# Patient Record
Sex: Male | Born: 1965 | Race: White | Hispanic: No | Marital: Married | Smoking: Never smoker
Health system: Southern US, Community
[De-identification: ages and names within clinical notes are randomized; demographics above are authoritative.]

## PROBLEM LIST (undated history)

## (undated) DIAGNOSIS — R011 Cardiac murmur, unspecified: Secondary | ICD-10-CM

## (undated) DIAGNOSIS — N135 Crossing vessel and stricture of ureter without hydronephrosis: Secondary | ICD-10-CM

## (undated) DIAGNOSIS — M1990 Unspecified osteoarthritis, unspecified site: Secondary | ICD-10-CM

## (undated) HISTORY — PX: KNEE ARTHROSCOPY W/ MENISCAL REPAIR: SHX1877

## (undated) HISTORY — PX: KNEE ARTHROSCOPY: SUR90

## (undated) HISTORY — PX: TONSILLECTOMY: SUR1361

## (undated) HISTORY — PX: WISDOM TOOTH EXTRACTION: SHX21

---

## 2010-07-12 ENCOUNTER — Encounter
Admission: RE | Admit: 2010-07-12 | Discharge: 2010-07-12 | Payer: Self-pay | Source: Home / Self Care | Attending: Internal Medicine | Admitting: Internal Medicine

## 2011-12-29 ENCOUNTER — Other Ambulatory Visit: Payer: Self-pay | Admitting: Orthopaedic Surgery

## 2011-12-29 DIAGNOSIS — M25562 Pain in left knee: Secondary | ICD-10-CM

## 2012-01-06 ENCOUNTER — Other Ambulatory Visit: Payer: Self-pay | Admitting: Orthopaedic Surgery

## 2012-01-06 ENCOUNTER — Ambulatory Visit
Admission: RE | Admit: 2012-01-06 | Discharge: 2012-01-06 | Disposition: A | Discharge status: Home / Self Care | Payer: 59 | Source: Ambulatory Visit | Attending: Orthopaedic Surgery | Admitting: Orthopaedic Surgery

## 2012-01-06 DIAGNOSIS — M25562 Pain in left knee: Secondary | ICD-10-CM

## 2012-02-02 ENCOUNTER — Other Ambulatory Visit: Payer: Self-pay | Admitting: Orthopaedic Surgery

## 2012-02-02 ENCOUNTER — Ambulatory Visit
Admission: RE | Admit: 2012-02-02 | Discharge: 2012-02-02 | Disposition: A | Discharge status: Home / Self Care | Payer: 59 | Source: Ambulatory Visit | Attending: Orthopaedic Surgery | Admitting: Orthopaedic Surgery

## 2012-02-02 DIAGNOSIS — R609 Edema, unspecified: Secondary | ICD-10-CM

## 2012-07-28 ENCOUNTER — Other Ambulatory Visit: Payer: Self-pay | Admitting: Orthopaedic Surgery

## 2012-07-28 DIAGNOSIS — R609 Edema, unspecified: Secondary | ICD-10-CM

## 2012-07-28 DIAGNOSIS — R52 Pain, unspecified: Secondary | ICD-10-CM

## 2012-08-03 ENCOUNTER — Other Ambulatory Visit: Payer: 59

## 2013-11-09 ENCOUNTER — Other Ambulatory Visit (HOSPITAL_COMMUNITY): Payer: Self-pay | Admitting: Orthopaedic Surgery

## 2013-11-09 ENCOUNTER — Ambulatory Visit (HOSPITAL_COMMUNITY)
Admission: RE | Admit: 2013-11-09 | Discharge: 2013-11-09 | Disposition: A | Discharge status: Home / Self Care | Payer: 59 | Source: Ambulatory Visit | Attending: Orthopaedic Surgery | Admitting: Orthopaedic Surgery

## 2013-11-09 DIAGNOSIS — M545 Low back pain, unspecified: Secondary | ICD-10-CM

## 2013-11-09 DIAGNOSIS — M51379 Other intervertebral disc degeneration, lumbosacral region without mention of lumbar back pain or lower extremity pain: Secondary | ICD-10-CM | POA: Insufficient documentation

## 2013-11-09 DIAGNOSIS — M48061 Spinal stenosis, lumbar region without neurogenic claudication: Secondary | ICD-10-CM | POA: Insufficient documentation

## 2013-11-09 DIAGNOSIS — M5126 Other intervertebral disc displacement, lumbar region: Secondary | ICD-10-CM | POA: Insufficient documentation

## 2013-11-09 DIAGNOSIS — M5146 Schmorl's nodes, lumbar region: Secondary | ICD-10-CM | POA: Insufficient documentation

## 2013-11-09 DIAGNOSIS — M5137 Other intervertebral disc degeneration, lumbosacral region: Secondary | ICD-10-CM | POA: Insufficient documentation

## 2014-04-21 ENCOUNTER — Other Ambulatory Visit (HOSPITAL_COMMUNITY): Payer: Self-pay | Admitting: Specialist

## 2014-04-28 ENCOUNTER — Other Ambulatory Visit (HOSPITAL_COMMUNITY): Payer: Self-pay | Admitting: *Deleted

## 2014-04-28 ENCOUNTER — Encounter (HOSPITAL_COMMUNITY): Payer: Self-pay | Admitting: Pharmacy Technician

## 2014-04-28 ENCOUNTER — Encounter (HOSPITAL_COMMUNITY)
Admission: RE | Admit: 2014-04-28 | Discharge: 2014-04-28 | Disposition: A | Discharge status: Home / Self Care | Payer: 59 | Source: Ambulatory Visit | Attending: Specialist | Admitting: Specialist

## 2014-04-28 ENCOUNTER — Encounter (HOSPITAL_COMMUNITY): Payer: Self-pay

## 2014-04-28 DIAGNOSIS — M5126 Other intervertebral disc displacement, lumbar region: Secondary | ICD-10-CM | POA: Insufficient documentation

## 2014-04-28 DIAGNOSIS — Z01812 Encounter for preprocedural laboratory examination: Secondary | ICD-10-CM | POA: Insufficient documentation

## 2014-04-28 HISTORY — DX: Unspecified osteoarthritis, unspecified site: M19.90

## 2014-04-28 HISTORY — DX: Crossing vessel and stricture of ureter without hydronephrosis: N13.5

## 2014-04-28 HISTORY — DX: Cardiac murmur, unspecified: R01.1

## 2014-04-28 LAB — COMPREHENSIVE METABOLIC PANEL
ALBUMIN: 4.3 g/dL (ref 3.5–5.2)
ALT: 25 U/L (ref 0–53)
ANION GAP: 12 (ref 5–15)
AST: 40 U/L — ABNORMAL HIGH (ref 0–37)
Alkaline Phosphatase: 77 U/L (ref 39–117)
BILIRUBIN TOTAL: 0.4 mg/dL (ref 0.3–1.2)
BUN: 12 mg/dL (ref 6–23)
CALCIUM: 9.4 mg/dL (ref 8.4–10.5)
CHLORIDE: 101 meq/L (ref 96–112)
CO2: 25 meq/L (ref 19–32)
CREATININE: 0.9 mg/dL (ref 0.50–1.35)
GFR calc Af Amer: >90 mL/min (ref >90)
Glucose, Bld: 85 mg/dL (ref 70–99)
Potassium: 4.1 mEq/L (ref 3.7–5.3)
Sodium: 138 mEq/L (ref 137–147)
Total Protein: 7 g/dL (ref 6.0–8.3)

## 2014-04-28 LAB — CBC
HEMATOCRIT: 36 % — AB (ref 39.0–52.0)
Hemoglobin: 12.7 g/dL — ABNORMAL LOW (ref 13.0–17.0)
MCH: 32.6 pg (ref 26.0–34.0)
MCHC: 35.3 g/dL (ref 30.0–36.0)
MCV: 92.5 fL (ref 78.0–100.0)
Platelets: 229 10*3/uL (ref 150–400)
RBC: 3.89 MIL/uL — ABNORMAL LOW (ref 4.22–5.81)
RDW: 13.5 % (ref 11.5–15.5)
WBC: 4.3 10*3/uL (ref 4.0–10.5)

## 2014-04-28 LAB — SURGICAL PCR SCREEN
MRSA, PCR: NEGATIVE
Staphylococcus aureus: NEGATIVE

## 2014-04-28 NOTE — Progress Notes (Signed)
Pt's PCP is Dr. Nehemiah [facility]Polite [facility]

## 2014-04-28 NOTE — Pre-Procedure Instructions (Signed)
Brian Merritt  04/28/2014   Your procedure is scheduled on:  Friday, May 05, 2014 at 12:30 AM.   [facility] at 10:30 AM.   Call this number if you have problems the morning of surgery: [phone]   Remember:   Do not eat food or drink liquids after midnight Thursday, 05/04/14.   Take these medicines the morning of surgery with A SIP OF WATER: Oxycodone HCl - if needed  Stop Vitamins as of today.   Do not wear jewelry.  Do not wear lotions, powders, or cologne. You may wear deodorant.  Men may shave face and neck.  Do not bring valuables to the hospital.  [facility] is not responsible                  for any belongings or valuables.               Contacts, dentures or bridgework may not be worn into surgery.  Leave suitcase in the car. After surgery it may be brought to your room.  For patients admitted to the hospital, discharge time is determined by your                treatment team.               Patients discharged the day of surgery will not be allowed to drive home.    Special Instructions: [place] - Preparing for Surgery  Before surgery, you can play an important role.  Because skin is not sterile, your skin needs to be as free of germs as possible.  You can reduce the number of germs on you skin by washing with CHG (chlorahexidine gluconate) soap before surgery.  CHG is an antiseptic cleaner which kills germs and bonds with the skin to continue killing germs even after washing.  Please DO NOT use if you have an allergy to CHG or antibacterial soaps.  If your skin becomes reddened/irritated stop using the CHG and inform your nurse when you arrive at Short Stay.  Do not shave (including legs and underarms) for at least 48 hours prior to the first CHG shower.  You may shave your face.  Please follow these instructions carefully:   1.  Shower with CHG Soap the night before surgery and the                                 morning of Surgery.  2.  If you choose to wash your hair, wash your hair first as usual with your       normal shampoo.  3.  After you shampoo, rinse your hair and body thoroughly to remove the                      Shampoo.  4.  Use CHG as you would any other liquid soap.  You can apply chg directly       to the skin and wash gently with scrungie or a clean washcloth.  5.  Apply the CHG Soap to your body ONLY FROM THE NECK DOWN.        Do not use on open wounds or open sores.  Avoid contact with your eyes, ears, mouth and genitals (private parts).  Wash genitals (private parts) with your normal soap.  6.  Wash thoroughly, paying special attention to the area  where your surgery        will be performed.  7.  Thoroughly rinse your body with warm water from the neck down.  8.  DO NOT shower/wash with your normal soap after using and rinsing off       the CHG Soap.  9.  Pat yourself dry with a clean towel.            10.  Wear clean pajamas.            11.  Place clean sheets on your bed the night of your first shower and do not        sleep with pets.  Day of Surgery  Do not apply any lotions the morning of surgery.  Please wear clean clothes to the hospital.     Please read over the following fact sheets that you were given: Pain Booklet, Coughing and Deep Breathing, MRSA Information and Surgical Site Infection Prevention

## 2014-05-03 NOTE — H&P (Signed)
Brian Merritt is an 48 y.o. male.  Chief Complaint: back pain and right LE pain and weakness.  HISTORY OF PRESENT ILLNESS:   He relates that he is experiencing back pain that is persisting with radiation into his right lower extremity.  The pain is mainly into the right leg more than it is into his back.  No particular numbness. Reports there is always a dull-type discomfort into his right lower extremity.  He has tried a Medrol Dosepak but it did not seem to be of much benefit.  Pt has been followed for his back pain and he has a herniated disc at the  L3-4 level with a free fragment on the right side that extends downward along the right L4 nerve.  He relates that he is at a point where he wishes to go ahead and consider intervention surgically.  This discomfort started in May 2015 and he has been trying to get over the problem of nerve compression and pain radiating into his right lower extremity with sciatica since May.  The pain is sharp, radiating into the right lower extremity anteriorly and laterally along the right calf.  He initially had weakness associated with the right lower extremity and weakness into his right knee extension.  He notes that he still has some weakness in the right leg.  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  His tension signs were positive.  He underwent epidural steroid injection on the right side at the L3-4 level transforaminal as well as at the right L4 transforaminal between L4 and L5 in order to decrease the swelling associated with this nerve.  Has been performing exercises on his own.  He has been doing a regimen of both extension and flexion exercises.  He finds that with extension the pain is worse.  With standing and ambulation the pain is worse.  Workplace has tried to find ways to help him to be able to adapt to his back pain.  He is using  elevated desks and elevated chairs, but overall he has been putting up with this pain and discomfort for almost 4 months without a great deal of relief of pain.  He is continuing to require OxyContin 20-mg tablet 1 every 12 hours to try an relieve his pain.  He has been on OxyContin now since June 4, nearly 3 months.  Sterapred dose pack was given in May and then repeated a second time in May and then he received a 12-day dose pack in August, so he has had a fair amount of attempts at quelling the swelling and inflammation of the nerve with steroid treatment.   He still persists with pain.  On a scale of 1 to 10 he relates his pain as about a 7 or 8.  He has difficulty sleeping at night.  He is sleeping perhaps 3 or 4 hours maximum.  He has persistent weakness going up and down stairs with the right leg. RADIOGRAPHS:  His MRI scan has demonstrated right subarticular disc extrusion with free fragment and the right lateral recess posterior to the L4 vertebral body.  This is impressing on the right L4 nerve root.  He also has a shallow central protrusion at L4-5 with some mild stenosis at this segment.  We did perform x-rays on him.  He underwent flexion and extension lateral radiographs.  His radiographs  show degenerative disc narrowing at L4-5 and also some minimal narrowing at L3-4 and at L2-3.  Anterior spurring is evident at L2-3, L3-4 and L4-5.  Disc space at L5-S1 is also narrowed, so that he does have multilevel degenerative disc changes here.  With flexion and extension, I see no gross abnormal motion across the lumbar segments.  There is no sign of dynamic instability here so that the problem seems to be primarily a right-sided disc herniation at L4-5 with persistent L4 radiculopathy.  ASSESSMENT:  This patient is nearly 4 months out from having a right-sided L3-4 disc herniation with extruded fragment into the lateral canal at L3-4, extending down ventral to the L4 nerve root.  He has significant  persistent sciatic tension signs, severe limitations of function and is requiring continued narcotic medicines to relieve his pain.  PLAN:  At this point pt is at a point where resection of the herniated disc and decompression of his L4 root will provide him with the best treatment and at this point this is a conservative way to treat ongoing severe radicular symptoms due to a disc herniation with extruded fragment. pt will be undergoing a right-sided L3-4 microdiscectomy with excision of extruded fragment.  We will explore the disc and ensure that there are no further fragments within the disc that require excision in addition to excising the extruded fragment that extends caudally ventral to the L4 nerve root.  The risks of surgery including risk of infection, bleeding, and risk of neurologic compromise discussed with pt and he wishes to proceed.   Past Medical History  Diagnosis Date  . Heart murmur     as a child  . Arthritis     knee  . Ureteral stricture     as a child    Past Surgical History  Procedure Laterality Date  . Knee arthroscopy w/ meniscal repair Left   . Tonsillectomy    . Wisdom tooth extraction      Family History  Problem Relation Age of Onset  . Heart disease Mother   . Hypertension Father    Social History:  reports that he has never smoked. His smokeless tobacco use includes Chew. He reports that he drinks alcohol. He reports that he does not use illicit drugs.  Allergies:  Allergies  Allergen Reactions  . Advil [Ibuprofen] Hives    Hives, swelling  Can take Motrin, but usually takes tylenol or aleve  . Penicillins Hives    Medications Prior to Admission  Medication Sig Dispense Refill  . Multiple Vitamins-Minerals (ONE DAILY MENS) TABS Take 1 tablet by mouth daily.    . Oxycodone HCl 20 MG TABS Take 20 mg by mouth 2 (two) times daily as needed (pain).      No results found for this or any previous visit (from the past 48 hour(s)). No results  found.  Review of Systems  Musculoskeletal: Positive for back pain.  Neurological:       Right leg pain  All other systems reviewed and are negative.   Blood pressure 112/75, pulse 57, temperature 97.7 F (36.5 C), temperature source Oral, resp. rate 18, height 6' 2.5" (1.892 m), weight 83.008 kg (183 lb), SpO2 100 %. Physical Exam  Constitutional: He is oriented to person, place, and time. He appears well-developed and well-nourished.  HENT:  Head: Normocephalic and atraumatic.  Eyes: EOM are normal. Pupils are equal, round, and reactive to light.  Neck: Normal range of motion.  Cardiovascular: Normal rate, regular  rhythm and intact distal pulses.   Respiratory: Effort normal and breath sounds normal.  GI: Soft.  Musculoskeletal:    He has a positive straight leg raise sign on the right side in a dangling position, 30 degrees short of full extension.  He has a positive popliteal compression sign on the right.  Weakness in right foot dorsiflexion, 5-/5 but improved compared with his previous exam when he first presented.  Right knee extension strength is intact.  He is unable to fully extend, though, without reversing normal lordotic curve and extending his back.  Reverse straight leg raise is positive on the right.  Neurological: He is alert and oriented to person, place, and time.  Skin: Skin is warm and dry.  Psychiatric: He has a normal mood and affect.     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  Delmos Velaquez E 05/05/2014, 1:13 PM

## 2014-05-04 MED ORDER — CLINDAMYCIN PHOSPHATE 900 MG/50ML IV SOLN
900.0000 mg | INTRAVENOUS | Status: AC
  Administered 2014-05-05: 900 mg via INTRAVENOUS
  Filled 2014-05-04: qty 50

## 2014-05-05 ENCOUNTER — Ambulatory Visit (HOSPITAL_COMMUNITY)
Admission: RE | Admit: 2014-05-05 | Discharge: 2014-05-05 | Disposition: A | Discharge status: Home / Self Care | Payer: 59 | Source: Ambulatory Visit | Attending: Specialist | Admitting: Specialist

## 2014-05-05 ENCOUNTER — Encounter (HOSPITAL_COMMUNITY): Payer: Self-pay | Admitting: Anesthesiology

## 2014-05-05 ENCOUNTER — Ambulatory Visit (HOSPITAL_COMMUNITY): Payer: 59

## 2014-05-05 ENCOUNTER — Encounter (HOSPITAL_COMMUNITY)
Admission: RE | Disposition: A | Discharge status: Home / Self Care | Payer: 59 | Source: Ambulatory Visit | Attending: Specialist

## 2014-05-05 ENCOUNTER — Ambulatory Visit (HOSPITAL_COMMUNITY): Payer: 59 | Admitting: Anesthesiology

## 2014-05-05 DIAGNOSIS — Z419 Encounter for procedure for purposes other than remedying health state, unspecified: Secondary | ICD-10-CM

## 2014-05-05 DIAGNOSIS — M5126 Other intervertebral disc displacement, lumbar region: Secondary | ICD-10-CM | POA: Diagnosis present

## 2014-05-05 DIAGNOSIS — M5116 Intervertebral disc disorders with radiculopathy, lumbar region: Secondary | ICD-10-CM | POA: Insufficient documentation

## 2014-05-05 HISTORY — PX: LUMBAR LAMINECTOMY: SHX95

## 2014-05-05 SURGERY — MICRODISCECTOMY LUMBAR LAMINECTOMY
Anesthesia: General | Site: Spine Lumbar

## 2014-05-05 MED ORDER — 0.9 % SODIUM CHLORIDE (POUR BTL) OPTIME
TOPICAL | Status: DC | PRN
  Administered 2014-05-05: 1000 mL

## 2014-05-05 MED ORDER — CHLORHEXIDINE GLUCONATE 4 % EX LIQD: 60.0000 mL | Freq: Once | CUTANEOUS | Status: DC

## 2014-05-05 MED ORDER — FENTANYL CITRATE 0.05 MG/ML IJ SOLN
INTRAMUSCULAR | Status: AC
  Filled 2014-05-05: qty 5

## 2014-05-05 MED ORDER — METHOCARBAMOL 1000 MG/10ML IJ SOLN
500.0000 mg | Freq: Four times a day (QID) | INTRAVENOUS | Status: DC | PRN
  Filled 2014-05-05: qty 5

## 2014-05-05 MED ORDER — OXYCODONE-ACETAMINOPHEN 5-325 MG PO TABS: 1.0000 | ORAL_TABLET | ORAL | Status: DC | PRN

## 2014-05-05 MED ORDER — THROMBIN 20000 UNITS EX KIT
PACK | CUTANEOUS | Status: DC | PRN
  Administered 2014-05-05: 14:00:00 via TOPICAL

## 2014-05-05 MED ORDER — OXYCODONE HCL 5 MG/5ML PO SOLN
5.0000 mg | Freq: Once | ORAL | Status: AC | PRN
Start: 2014-05-05 — End: 2014-05-05

## 2014-05-05 MED ORDER — GLYCOPYRROLATE 0.2 MG/ML IJ SOLN
INTRAMUSCULAR | Status: DC | PRN
  Administered 2014-05-05: .8 mg via INTRAVENOUS

## 2014-05-05 MED ORDER — ROCURONIUM BROMIDE 50 MG/5ML IV SOLN
INTRAVENOUS | Status: AC
  Filled 2014-05-05: qty 1

## 2014-05-05 MED ORDER — METHOCARBAMOL 500 MG PO TABS: 500.0000 mg | ORAL_TABLET | Freq: Four times a day (QID) | ORAL | Status: DC | PRN

## 2014-05-05 MED ORDER — NEOSTIGMINE METHYLSULFATE 10 MG/10ML IV SOLN
INTRAVENOUS | Status: DC | PRN
  Administered 2014-05-05: 4 mg via INTRAVENOUS

## 2014-05-05 MED ORDER — HYDROMORPHONE HCL 1 MG/ML IJ SOLN: 0.2500 mg | INTRAMUSCULAR | Status: DC | PRN

## 2014-05-05 MED ORDER — ROCURONIUM BROMIDE 100 MG/10ML IV SOLN
INTRAVENOUS | Status: DC | PRN
  Administered 2014-05-05: 50 mg via INTRAVENOUS

## 2014-05-05 MED ORDER — METHOCARBAMOL 500 MG PO TABS
500.0000 mg | ORAL_TABLET | Freq: Four times a day (QID) | ORAL | Status: DC | PRN
  Administered 2014-05-05: 500 mg via ORAL
  Filled 2014-05-05: qty 1

## 2014-05-05 MED ORDER — LIDOCAINE HCL (CARDIAC) 20 MG/ML IV SOLN
INTRAVENOUS | Status: DC | PRN
  Administered 2014-05-05: 50 mg via INTRAVENOUS

## 2014-05-05 MED ORDER — LACTATED RINGERS IV SOLN
INTRAVENOUS | Status: DC
  Administered 2014-05-05: 11:00:00 via INTRAVENOUS

## 2014-05-05 MED ORDER — LACTATED RINGERS IV SOLN
INTRAVENOUS | Status: DC | PRN
  Administered 2014-05-05 (×3): via INTRAVENOUS

## 2014-05-05 MED ORDER — NEOSTIGMINE METHYLSULFATE 10 MG/10ML IV SOLN
INTRAVENOUS | Status: AC
  Filled 2014-05-05: qty 1

## 2014-05-05 MED ORDER — MIDAZOLAM HCL 2 MG/2ML IJ SOLN
INTRAMUSCULAR | Status: AC
  Filled 2014-05-05: qty 2

## 2014-05-05 MED ORDER — BUPIVACAINE HCL 0.5 % IJ SOLN
INTRAMUSCULAR | Status: DC | PRN
  Administered 2014-05-05: 20 mL

## 2014-05-05 MED ORDER — LIDOCAINE HCL (CARDIAC) 20 MG/ML IV SOLN
INTRAVENOUS | Status: AC
  Filled 2014-05-05: qty 5

## 2014-05-05 MED ORDER — OXYCODONE HCL 5 MG PO TABS
5.0000 mg | ORAL_TABLET | Freq: Once | ORAL | Status: AC | PRN
  Administered 2014-05-05: 5 mg via ORAL

## 2014-05-05 MED ORDER — OXYCODONE HCL 5 MG PO TABS
ORAL_TABLET | ORAL | Status: AC
  Filled 2014-05-05: qty 1

## 2014-05-05 MED ORDER — DEXAMETHASONE SODIUM PHOSPHATE 4 MG/ML IJ SOLN
INTRAMUSCULAR | Status: DC | PRN
  Administered 2014-05-05: 8 mg via INTRAVENOUS

## 2014-05-05 MED ORDER — GLYCOPYRROLATE 0.2 MG/ML IJ SOLN
INTRAMUSCULAR | Status: AC
  Filled 2014-05-05: qty 3

## 2014-05-05 MED ORDER — PROPOFOL 10 MG/ML IV BOLUS
INTRAVENOUS | Status: DC | PRN
  Administered 2014-05-05: 200 mg via INTRAVENOUS

## 2014-05-05 MED ORDER — METHOCARBAMOL 500 MG PO TABS
ORAL_TABLET | ORAL | Status: AC
  Filled 2014-05-05: qty 1

## 2014-05-05 MED ORDER — ONDANSETRON HCL 4 MG/2ML IJ SOLN
INTRAMUSCULAR | Status: AC
  Filled 2014-05-05: qty 2

## 2014-05-05 MED ORDER — MIDAZOLAM HCL 5 MG/5ML IJ SOLN
INTRAMUSCULAR | Status: DC | PRN
  Administered 2014-05-05: 2 mg via INTRAVENOUS

## 2014-05-05 MED ORDER — FENTANYL CITRATE 0.05 MG/ML IJ SOLN
INTRAMUSCULAR | Status: DC | PRN
  Administered 2014-05-05: 50 ug via INTRAVENOUS
  Administered 2014-05-05: 250 ug via INTRAVENOUS

## 2014-05-05 MED ORDER — THROMBIN 20000 UNITS EX SOLR
CUTANEOUS | Status: AC
  Filled 2014-05-05: qty 20000

## 2014-05-05 MED ORDER — BUPIVACAINE LIPOSOME 1.3 % IJ SUSP
20.0000 mL | Freq: Once | INTRAMUSCULAR | Status: AC
  Administered 2014-05-05: 20 mL
  Filled 2014-05-05: qty 20

## 2014-05-05 MED ORDER — ONDANSETRON HCL 4 MG/2ML IJ SOLN
INTRAMUSCULAR | Status: DC | PRN
  Administered 2014-05-05: 4 mg via INTRAVENOUS

## 2014-05-05 SURGICAL SUPPLY — 51 items
BUR MATCHSTICK NEURO 3.0 LAGG (BURR) IMPLANT
BUR ROUND FLUTED 4 SOFT TCH (BURR) ×2 IMPLANT
BUR ROUND FLUTED 4MM SOFT TCH (BURR) ×1
CANISTER SUCTION 2500CC (MISCELLANEOUS) ×3 IMPLANT
CORDS BIPOLAR (ELECTRODE) ×3 IMPLANT
COVER MAYO STAND STRL (DRAPES) ×3 IMPLANT
COVER SURGICAL LIGHT HANDLE (MISCELLANEOUS) ×3 IMPLANT
DERMABOND ADVANCED (GAUZE/BANDAGES/DRESSINGS) ×2
DERMABOND ADVANCED .7 DNX12 (GAUZE/BANDAGES/DRESSINGS) ×1 IMPLANT
DRAPE C-ARM 42X72 X-RAY (DRAPES) ×3 IMPLANT
DRAPE MICROSCOPE LEICA (MISCELLANEOUS) ×3 IMPLANT
DRAPE POUCH INSTRU U-SHP 10X18 (DRAPES) ×3 IMPLANT
DRAPE PROXIMA HALF (DRAPES) IMPLANT
DRAPE SURG 17X23 STRL (DRAPES) ×12 IMPLANT
DRSG MEPILEX BORDER 4X4 (GAUZE/BANDAGES/DRESSINGS) ×3 IMPLANT
DRSG MEPILEX BORDER 4X8 (GAUZE/BANDAGES/DRESSINGS) IMPLANT
DURAPREP 26ML APPLICATOR (WOUND CARE) ×3 IMPLANT
ELECT BLADE 4.0 EZ CLEAN MEGAD (MISCELLANEOUS) ×3
ELECT CAUTERY BLADE 6.4 (BLADE) ×3 IMPLANT
ELECT REM PT RETURN 9FT ADLT (ELECTROSURGICAL) ×3
ELECTRODE BLDE 4.0 EZ CLN MEGD (MISCELLANEOUS) ×1 IMPLANT
ELECTRODE REM PT RTRN 9FT ADLT (ELECTROSURGICAL) ×1 IMPLANT
GLOVE BIOGEL PI IND STRL 7.5 (GLOVE) ×1 IMPLANT
GLOVE BIOGEL PI INDICATOR 7.5 (GLOVE) ×2
GLOVE ECLIPSE 7.0 STRL STRAW (GLOVE) ×3 IMPLANT
GLOVE ECLIPSE 9.0 STRL (GLOVE) ×3 IMPLANT
GLOVE SURG 8.5 LATEX PF (GLOVE) ×3 IMPLANT
GOWN STRL REUS W/ TWL LRG LVL3 (GOWN DISPOSABLE) ×3 IMPLANT
GOWN STRL REUS W/TWL 2XL LVL3 (GOWN DISPOSABLE) ×3 IMPLANT
GOWN STRL REUS W/TWL LRG LVL3 (GOWN DISPOSABLE) ×6
GOWN STRL REUS W/TWL XL LVL3 (GOWN DISPOSABLE) ×3 IMPLANT
KIT BASIN OR (CUSTOM PROCEDURE TRAY) ×3 IMPLANT
KIT ROOM TURNOVER OR (KITS) ×3 IMPLANT
NEEDLE 22X1 1/2 (OR ONLY) (NEEDLE) ×3 IMPLANT
NEEDLE SPNL 18GX3.5 QUINCKE PK (NEEDLE) ×6 IMPLANT
NS IRRIG 1000ML POUR BTL (IV SOLUTION) ×3 IMPLANT
PACK LAMINECTOMY ORTHO (CUSTOM PROCEDURE TRAY) ×3 IMPLANT
PAD ARMBOARD 7.5X6 YLW CONV (MISCELLANEOUS) ×6 IMPLANT
PATTIES SURGICAL .5 X.5 (GAUZE/BANDAGES/DRESSINGS) IMPLANT
PATTIES SURGICAL .75X.75 (GAUZE/BANDAGES/DRESSINGS) IMPLANT
SPONGE LAP 4X18 X RAY DECT (DISPOSABLE) ×3 IMPLANT
SPONGE SURGIFOAM ABS GEL 100 (HEMOSTASIS) ×3 IMPLANT
SUT VIC AB 1 CT1 27 (SUTURE)
SUT VIC AB 1 CT1 27XBRD ANBCTR (SUTURE) IMPLANT
SUT VIC AB 2-0 CT1 27 (SUTURE) ×2
SUT VIC AB 2-0 CT1 TAPERPNT 27 (SUTURE) ×1 IMPLANT
SUT VICRYL 0 UR6 27IN ABS (SUTURE) ×3 IMPLANT
SUT VICRYL 4-0 PS2 18IN ABS (SUTURE) ×3 IMPLANT
SYR CONTROL 10ML LL (SYRINGE) ×3 IMPLANT
TOWEL OR 17X24 6PK STRL BLUE (TOWEL DISPOSABLE) ×3 IMPLANT
TOWEL OR 17X26 10 PK STRL BLUE (TOWEL DISPOSABLE) ×3 IMPLANT

## 2014-05-05 NOTE — Brief Op Note (Signed)
05/05/2014  4:07 PM  PATIENT:  Brian Merritt  48 y.o. 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 Jean RosenthalJackson.  EBL:  Total I/O In: 2000 [I.V.:2000] Out: 200 [Blood:200]  BLOOD ADMINISTERED:none  DRAINS: none   LOCAL MEDICATIONS USED: MARCAINE  1/2% 1:1 EXPAREL 1.3%, Amount: 30 ml  SPECIMEN:  No Specimen  DISPOSITION OF SPECIMEN:  N/A  COUNTS:  YES  TOURNIQUET:  * No tourniquets in log *  DICTATION: .Dragon Dictation  PLAN OF CARE: Discharge to home after PACU  PATIENT DISPOSITION:  PACU - hemodynamically stable.   Delay start of Pharmacological VTE agent (>24hrs) due to surgical blood loss or risk of bleeding: yes

## 2014-05-05 NOTE — Op Note (Signed)
05/05/2014  4:10 PM  PATIENT:  Brian Merritt  48 y.o. 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 Glennon Mac.    COMPLICATIONS:  None.     EBL:100cc  PROCEDURE:The patient was met in the holding area, and the appropriate right Lumbar level L3-4 identified and marked with "x" and my initials.The patient was then transported to OR and was placed under general anesthesia without difficulty. The patient received appropriate preoperative antibiotic prophylaxis. The patient after intubation atraumatically was transferred to the operating room table, prone position, Wilson frame, sliding OR table. All pressure points were well padded. The arms in 90-90 well-padded at the elbows. Standard prep with iodoform solution lower dorsal spine to the mid sacral segment. Draped in the usual manner clear Vi-Drape was used. Time-out procedure was called and correct. An 18-gauge spinal needle was then inserted at the expected L3-4 level. C-arm was draped sterilely to the field and used to identify the spinal needles positions. The spinal needle was at the superior aspect of the lamina of L4. Skin superior to this was then infiltrated with Marcaine 1/2% 1:1 Exparel 1.3%, total of 9 cc used. An incision approximately an inch inch and a half in length was then made through skin and subcutaneous layers in line with the right side of the expected midline just superior to the spinal needle entry point.  An incision made into the right lumbosacral fascia approximately an inch in length.  Smallest dilator was then introduced into the incision site and used to carefully form subperiosteal dissection of the paralumbar muscles off of the posterior lamina of the expected L3-4 level. Successive dilators were then carried up to the 11 mm size. The depth measured off of the dilators at about 60 mm and 60 mm retractors then placed on the scaffolding for the MIS equipment and guided over dilators down to and docking on the posterior aspect of the lamina at the expected L3-4 level. This was sterilely attached to the articulating arm and it's upright which had been attached the OR table sterilely. C-arm fluoroscopy was identified the dilators and the retractors at the appropriate level L3-4. The operating room microscope sterilely draped brought into the field. Under the operating room microscope, the L3-4 interspace carefully debrided the small amount of muscle attachment here and high-speed bur used to drill the medial aspect of the inferior articular process of L3 approximately 20%. A localization lateral C-arm view was obtained with Penfield 4 in the L3-4 facet. 2 mm Kerrison then used to enter the spinal canal over the superior aspect of the L4 lamina carefully using the Kerrison to debris the attachment as a curet. Foraminotomy was then performed over the right L4 nerve root. The medial 10% superior articular process of L4 and then resected using 2 mm Kerrison. This allowed for identification of the thecal sac. Penfield 4 was then used to carefully mobilize the thecal sac medially and the L4 nerve root identified within the lateral recess flattened over the  posterior aspect of the protruded disc. Carefully the lateral aspect of the L4 nerve root was identified and a Penfield 4 was used to mobilize the nerve medially such that the protruded disc was visible with microscope. Using a Penfield 4 for retraction and a 15 blade  scalpel was used to incise the posterior longitudinal ligament within the lateral recess on the right side longitudinally. Disc material was removed using micropituitary rongeurs and nerve hook nerve root and then more easily able to be mobilized medially and retracted using a Derricho retractor. Further foraminotomies was performed over the L4 nerve root the nerve root was noted to be X. without further compression. The nerve root able to be retracted along the medial aspect of the right L4 pedicle and disc material found to be subligamentous at this level was further resected current pituitary rongeurs. Ligamentum flavum was further debrided superiorly to the level L3-4 disc. Had a moderate amount of further resection of the L3 lamina inferiorly and mediallywas performed. With this then the disc space at L3-4 was easily visualized and entry into the disc at the sided disc herniation was possible using a Penfield 4 intraoperative lateral radiograph was used to identify the L3-4 disc with the [place] 4 In place just below the disc space. Micropituitary was used to further debride this material superficially from the posterior aspect of the intervertebral disc is posterior lateral aspect of the disc. Small amount of further disc material was found subligamentous extending laterally from the disc this was removed using micropituitary rongeurs into the right L3 neuroforamen additionally epstein currettes were used to decompress the right L3 neuroforamen. Upbiting currettes were used to further remove reflected portions of the reflected portions of the medial L3-4 facet and portions of the superior portion of the L4 superior articular facet. Ligamentum flavum was debrided and lateral recess along the medial aspect L3-4 facet no further decompression was necessary. Ball tip nerve probe was then able to carefully palpate the neuroforamen for L3 and L4 finding these to be well decompressed. Bleeding was then controlled  using thrombin-soaked Gelfoam small cottonoids. Small amount of bleeding within the soft tissue mass the laminotomy area was controlled using bipolar electrocautery. Irrigation was carried out using copious amounts of irrigant solution. All Gelfoam were then removed. No significant active bleeding present at the time of removal. All instruments sponge counts were correct traction system was then carefully removed carefully rotating retractors with this withdrawal and only bipolar electrocautery of any small bleeders. Lumbodorsal fascia was then carefully approximated with interrupted 0 Vicryl sutures, UR 6 needle deep subcutaneous layers were approximated with interrupted 0 Vicryl sutures on UR 6 the appear subcutaneous layers approximated with interrupted 2-0 Vicryl sutures and the skin closed with a running subcutaneous stitch of 4-0 Vicryl. Dermabond was applied allowed to dry and then Mepilex bandage applied. Patient was then carefully returned to supine position on a stretcher, reactivated and extubated. He was then returned to recovery room in satisfactory condition. Phillips Hay PA-C perform the duties of assistant surgeon during this case. She was present from the beginning of the case to the end of the case assisting in transfer the patient from his stretcher to the OR table and back to the stretcher at the end of the case. Assisted in careful retraction and suction of the laminectomy site delicate neural structures operating under the operating room microscope. She performed closure of the incision from the fascia to the skin applying the dressing.     NITKA,JAMES  E  05/05/2014, 4:10 PM

## 2014-05-05 NOTE — Anesthesia Postprocedure Evaluation (Signed)
  Anesthesia Post-op Note  Patient: Brian Merritt  Procedure(s) Performed: Procedure(s): RIGHT L3-4 MICRODISCECTOMY WITH MIS (N/A)  Patient Location: PACU  Anesthesia Type:General  Level of Consciousness: awake and alert   Airway and Oxygen Therapy: Patient Spontanous Breathing  Post-op Pain: mild  Post-op Assessment: Post-op Vital signs reviewed  Post-op Vital Signs: stable  Last Vitals:  Filed Vitals:   05/05/14 1700  BP: 114/72  Pulse: 53  Temp:   Resp: 16    Complications: No apparent anesthesia complicationsn

## 2014-05-05 NOTE — Interval H&P Note (Signed)
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 After consideration of risks, benefits and other options for treatment, the patient has consented to  Procedure(s): RIGHT L3-4 MICRODISCECTOMY WITH MIS (N/A) as a surgical intervention .  The patient's history has been reviewed, patient examined, no change in status, stable for surgery.  I have reviewed the patient's chart and labs.  Questions were answered to the patient's satisfaction.     Leylah Tarnow E

## 2014-05-05 NOTE — Anesthesia Procedure Notes (Signed)
Procedure Name: Intubation Date/Time: 05/05/2014 1:31 PM Performed by: Gwenyth AllegraADAMI, Teylor Wolven Pre-anesthesia Checklist: Patient identified, Timeout performed, Emergency Drugs available, Suction available and Patient being monitored Patient Re-evaluated:Patient Re-evaluated prior to inductionOxygen Delivery Method: Circle system utilized Preoxygenation: Pre-oxygenation with 100% oxygen Ventilation: Mask ventilation without difficulty Laryngoscope Size: Mac and 4 Grade View: Grade I Tube type: Oral Number of attempts: 1 Airway Equipment and Method: Stylet Placement Confirmation: ETT inserted through vocal cords under direct vision,  breath sounds checked- equal and bilateral and positive ETCO2 Secured at: 22 cm Tube secured with: Tape Dental Injury: Teeth and Oropharynx as per pre-operative assessment

## 2014-05-05 NOTE — Discharge Instructions (Signed)
    No lifting greater than 10 lbs. Avoid bending, stooping and twisting. Walk in house for first week them may start to get out slowly increasing distance up to one mile by 3 weeks post op. Keep incision dry for 3 days, may use tegaderm or similar water impervious dressing.  

## 2014-05-05 NOTE — Transfer of Care (Signed)
Immediate Anesthesia Transfer of Care Note  Patient: Brian Merritt  Procedure(s) Performed: Procedure(s): RIGHT L3-4 MICRODISCECTOMY WITH MIS (N/A)  Patient Location: PACU  Anesthesia Type:General  Level of Consciousness: awake and alert   Airway & Oxygen Therapy: Patient Spontanous Breathing and Patient connected to nasal cannula oxygen  Post-op Assessment: Report given to PACU RN and Post -op Vital signs reviewed and stable  Post vital signs: Reviewed and stable  Complications: No apparent anesthesia complications

## 2014-05-05 NOTE — Anesthesia Preprocedure Evaluation (Addendum)
Anesthesia Evaluation  Patient identified by MRN, date of birth, ID band Patient awake    Reviewed: Allergy & Precautions, H&P , NPO status , Patient's Chart, lab work & pertinent test results  Airway Mallampati: I  TM Distance: >3 FB Neck ROM: Full    Dental no notable dental hx. (+) Teeth Intact, Dental Advisory Given   Pulmonary neg pulmonary ROS,  breath sounds clear to auscultation  Pulmonary exam normal       Cardiovascular negative cardio ROS  Rhythm:Regular Rate:Normal     Neuro/Psych negative neurological ROS  negative psych ROS   GI/Hepatic negative GI ROS, Neg liver ROS,   Endo/Other  negative endocrine ROS  Renal/GU negative Renal ROS  negative genitourinary   Musculoskeletal   Abdominal   Peds  Hematology negative hematology ROS (+)   Anesthesia Other Findings   Reproductive/Obstetrics negative OB ROS                             Anesthesia Physical Anesthesia Plan  ASA: I  Anesthesia Plan: General   Post-op Pain Management:    Induction: Intravenous  Airway Management Planned: Oral ETT  Additional Equipment:   Intra-op Plan:   Post-operative Plan: Extubation in OR  Informed Consent: I have reviewed the patients History and Physical, chart, labs and discussed the procedure including the risks, benefits and alternatives for the proposed anesthesia with the patient or authorized representative who has indicated his/her understanding and acceptance.   Dental advisory given  Plan Discussed with: CRNA  Anesthesia Plan Comments:         Anesthesia Quick Evaluation  

## 2014-05-08 MED FILL — Thrombin For Soln 20000 Unit: CUTANEOUS | Qty: 1 | Status: AC

## 2014-05-09 ENCOUNTER — Encounter (HOSPITAL_COMMUNITY): Payer: Self-pay | Admitting: Specialist

## 2014-05-09 NOTE — Discharge Summary (Signed)
Physician Discharge Summary  Patient ID: Brian Merritt Sellers MRN: [id number] DOB/AGE: [birth year] 50 y.o.  Admit date: 05/05/2014 Discharge date: 05/05/2014  Admission Diagnoses:  HNP (herniated nucleus pulposus), lumbar right L3-4 with extruded fragment and L4 radiculopathy.  Discharge Diagnoses:  Principal Problem:   HNP (herniated nucleus pulposus), lumbar   Past Medical History  Diagnosis Date  . Heart murmur     as a child  . Arthritis     knee  . Ureteral stricture     as a child    Surgeries: Procedure(s): RIGHT L3-4 MICRODISCECTOMY WITH MIS on 05/05/2014   Consultants (if any):  none  Discharged Condition: Improved  Hospital Course: Brian Merritt Gorin is an 48 y.o. male who was admitted 05/05/2014 with a diagnosis of HNP (herniated nucleus pulposus), lumbar and went to the operating room on 05/05/2014 and underwent the above named procedures.    He was given perioperative antibiotics:      Anti-infectives    Start     Dose/Rate Route Frequency Ordered Stop   05/05/14 0600  clindamycin (CLEOCIN) IVPB 900 mg     900 mg100 mL/hr over 30 Minutes Intravenous On call to O.R. 05/04/14 1411 05/05/14 1400    Pt was stable post op and discharged from PACU on the day of surgery.  He was given sequential compression devices, early ambulation for DVT prophylaxis.  He benefited maximally from the hospital stay and there were no complications.    Recent vital signs:  Filed Vitals:   05/05/14 1715  BP:   Pulse:   Temp: 98 F (36.7 C)  Resp:     Recent laboratory studies:  Lab Results  Component Value Date   HGB 12.7* 04/28/2014   Lab Results  Component Value Date   WBC 4.3 04/28/2014   PLT 229 04/28/2014   No results found for: INR Lab Results  Component Value Date   NA 138 04/28/2014   K 4.1 04/28/2014   CL 101 04/28/2014   CO2 25 04/28/2014   BUN 12 04/28/2014   CREATININE 0.90 04/28/2014   GLUCOSE 85 04/28/2014    Discharge Medications:     Medication  List    TAKE these medications        methocarbamol 500 MG tablet  Commonly known as:  ROBAXIN  Take 1 tablet (500 mg total) by mouth every 6 (six) hours as needed for muscle spasms (spasm).     ONE DAILY MENS Tabs  Take 1 tablet by mouth daily.     Oxycodone HCl 20 MG Tabs  Take 20 mg by mouth 2 (two) times daily as needed (pain).     oxyCODONE-acetaminophen 5-325 MG per tablet  Commonly known as:  ROXICET  Take 1 tablet by mouth every 4 (four) hours as needed.        Diagnostic Studies: Dg Lumbar Spine 2-3 Views  05/05/2014   CLINICAL DATA:  Status post microdiskectomy  EXAM: LUMBAR SPINE -1 VIEW; DG C-ARM 61-120 MIN  COMPARISON:  None.  FINDINGS: Metallic probe tip is posterior to the superior aspect of the L4 vertebral body. No fracture or spondylolisthesis. There is disc space narrowing at L3-4.  IMPRESSION: Metallic probe tip posterior to the superior aspect of the L4 vertebral body. No apparent fracture or spondylolisthesis.   Electronically Signed   By: Bretta BangWilliam  Woodruff M.D.   On: 05/05/2014 14:59   Dg C-arm 1-60 Min  05/05/2014   CLINICAL DATA:  Status post microdiskectomy  EXAM: LUMBAR SPINE -1 VIEW;  DG C-ARM 61-120 MIN  COMPARISON:  None.  FINDINGS: Metallic probe tip is posterior to the superior aspect of the L4 vertebral body. No fracture or spondylolisthesis. There is disc space narrowing at L3-4.  IMPRESSION: Metallic probe tip posterior to the superior aspect of the L4 vertebral body. No apparent fracture or spondylolisthesis.   Electronically Signed   By: Bretta BangWilliam  Woodruff M.D.   On: 05/05/2014 14:59    Disposition: 01-Home or Self Care  Discharge Instructions    Call MD / Call 911    Complete by:  As directed   If you experience chest pain or shortness of breath, CALL 911 and be transported to the hospital emergency room.  If you develope a fever above 101 F, pus (white drainage) or increased drainage or redness at the wound, or calf pain, call your surgeon's  office.     Constipation Prevention    Complete by:  As directed   Drink plenty of fluids.  Prune juice may be helpful.  You may use a stool softener, such as Colace (over the counter) 100 mg twice a day.  Use MiraLax (over the counter) for constipation as needed.     Diet - low sodium heart healthy    Complete by:  As directed      Increase activity slowly as tolerated    Complete by:  As directed          No lifting greater than 10 lbs. Avoid bending, stooping and twisting. Walk in house for first week them may start to get out slowly increasing distance up to one mile by 3 weeks post op. Keep incision dry for 3 days, may use tegaderm or similar water impervious dressing.  Follow-up Information    Follow up with NITKA,JAMES E, MD In 2 weeks.   Specialty:  Orthopedic Surgery   Contact information:   [address] [place] [address] [phone]        Signed: Wende NeighborsVERNON,Maxfield Gildersleeve M 05/09/2014, 3:36 PM

## 2015-09-04 DIAGNOSIS — H900 Conductive hearing loss, bilateral: Secondary | ICD-10-CM | POA: Insufficient documentation

## 2015-09-04 DIAGNOSIS — H6123 Impacted cerumen, bilateral: Secondary | ICD-10-CM | POA: Insufficient documentation

## 2016-09-15 ENCOUNTER — Other Ambulatory Visit: Payer: Self-pay | Admitting: Physical Medicine and Rehabilitation

## 2016-09-15 DIAGNOSIS — M25562 Pain in left knee: Secondary | ICD-10-CM

## 2016-10-06 ENCOUNTER — Other Ambulatory Visit: Payer: Self-pay | Admitting: Physical Medicine and Rehabilitation

## 2016-10-06 DIAGNOSIS — R52 Pain, unspecified: Secondary | ICD-10-CM

## 2016-10-14 ENCOUNTER — Other Ambulatory Visit: Payer: 59

## 2016-10-22 ENCOUNTER — Ambulatory Visit
Admission: RE | Admit: 2016-10-22 | Discharge: 2016-10-22 | Disposition: A | Discharge status: Home / Self Care | Payer: 59 | Source: Ambulatory Visit | Attending: Physical Medicine and Rehabilitation | Admitting: Physical Medicine and Rehabilitation

## 2016-10-22 DIAGNOSIS — R52 Pain, unspecified: Secondary | ICD-10-CM

## 2018-01-05 ENCOUNTER — Ambulatory Visit (INDEPENDENT_AMBULATORY_CARE_PROVIDER_SITE_OTHER): Payer: 59 | Admitting: Orthopaedic Surgery

## 2018-01-05 ENCOUNTER — Encounter (INDEPENDENT_AMBULATORY_CARE_PROVIDER_SITE_OTHER): Payer: Self-pay | Admitting: Orthopaedic Surgery

## 2018-01-05 ENCOUNTER — Ambulatory Visit (INDEPENDENT_AMBULATORY_CARE_PROVIDER_SITE_OTHER): Payer: Self-pay

## 2018-01-05 VITALS — BP 131/86 | HR 68 | Ht 75.0 in | Wt 180.0 lb

## 2018-01-05 DIAGNOSIS — M25561 Pain in right knee: Secondary | ICD-10-CM

## 2018-01-05 MED ORDER — BUPIVACAINE HCL 0.5 % IJ SOLN
2.0000 mL | INTRAMUSCULAR | Status: AC | PRN
  Administered 2018-01-05: 2 mL via INTRA_ARTICULAR

## 2018-01-05 MED ORDER — METHYLPREDNISOLONE ACETATE 40 MG/ML IJ SUSP
80.0000 mg | INTRAMUSCULAR | Status: AC | PRN
  Administered 2018-01-05: 80 mg

## 2018-01-05 MED ORDER — TRAMADOL HCL 50 MG PO TABS: ORAL_TABLET | ORAL | 0 refills | Status: DC

## 2018-01-05 MED ORDER — LIDOCAINE HCL 1 % IJ SOLN
2.0000 mL | INTRAMUSCULAR | Status: AC | PRN
  Administered 2018-01-05: 2 mL

## 2018-01-05 NOTE — Progress Notes (Signed)
Office Visit Note   Patient: Brian Merritt           Date of Birth: Jul 18, 1965           MRN: [id number] Visit Date: 01/05/2018              Requested by: Renford DillsPolite, Ronald, MD [address] [place], [place] [address] PCP: Renford DillsPolite, Ronald, MD   Assessment & Plan: Visit Diagnoses:  1. Acute pain of right knee     Plan: Pain with several diagnostic possibilities.  I think the most likely diagnosis is a tear of the medial meniscus as the pain is localized on the medial compartment and has had a prior medial meniscectomy on the left.  He is going on vacation next week.  I have injected the knee with cortisone.  There was minimal effusion.  Check back after vacation and consider MRI scan and/or arthroscopy.  We will give him a prescription for tramadol to help with discomfort while he is on vacation  Follow-Up Instructions: No follow-ups on file.   Orders:  Orders Placed This Encounter  Procedures  . XR KNEE 3 VIEW RIGHT   No orders of the defined types were placed in this encounter.     Procedures: Large Joint Inj: R knee on 01/05/2018 2:26 PM Indications: pain and diagnostic evaluation Details: 25 G 1.5 in needle, anteromedial approach  Arthrogram: No  Medications: 2 mL lidocaine 1 %; 2 mL bupivacaine 0.5 %; 80 mg methylPREDNISolone acetate 40 MG/ML Aspirate: 1 mL clear Procedure, treatment alternatives, risks and benefits explained, specific risks discussed. Consent was given by the patient. Immediately prior to procedure a time out was called to verify the correct patient, procedure, equipment, support staff and site/side marked as required. Patient was prepped and draped in the usual sterile fashion.       Clinical Data: No additional findings.   Subjective: Chief Complaint  Patient presents with  . New Patient (Initial Visit)    R KNEE PAIN FOR 1 MO NO INJURY, NO INJECTIONS  Insidious onset of right knee pain approximately a month ago without injury or  trauma.  Pain is somewhat similar to pain he had in his left knee  years ago with a tear of the medial meniscus.  Had successful arthroscopic debridement.  Right knee is felt tight.  He has been unable to fully extend his knee predominantly medial joint pain.   HPI  Review of Systems  Constitutional: Negative for fatigue and fever.  HENT: Negative for ear pain.   Eyes: Negative for pain.  Respiratory: Negative for cough and shortness of breath.   Cardiovascular: Negative for leg swelling.  Gastrointestinal: Negative for diarrhea and nausea.  Genitourinary: Negative for difficulty urinating.  Musculoskeletal: Positive for back pain. Negative for neck pain.  Skin: Negative for rash.  Allergic/Immunologic: Negative for food allergies.  Neurological: Negative for weakness and numbness.  Hematological: Does not bruise/bleed easily.  Psychiatric/Behavioral: Negative for sleep disturbance.     Objective: Vital Signs: BP 131/86 (BP Location: Left Arm, Patient Position: Sitting, Cuff Size: Normal)   Pulse 68   Ht 6\' 3"  (1.905 m)   Wt 180 lb (81.6 kg)   BMI 22.50 kg/m   Physical Exam  Constitutional: He is oriented to person, place, and time. He appears well-developed and well-nourished.  HENT:  Mouth/Throat: Oropharynx is clear and moist.  Eyes: Pupils are equal, round, and reactive to light. EOM are normal.  Pulmonary/Chest: Effort normal.  Neurological: He is alert and oriented to person, place, and time.  Skin: Skin is warm and dry.  Psychiatric: He has a normal mood and affect. His behavior is normal.    Ortho Exam awake alert and oriented x3.  Comfortable sitting.  Walks with a distinct limp with inability to fully extend his right knee.  Small effusion right knee.  Pain diffusely along the medial joint predominantly anteriorly.  No no swelling distally.  Neurovascular exam intact.  Stability Specialty Comments:  No specialty comments available.  Imaging: No results  found.   PMFS History: Patient Active Problem List   Diagnosis Date Noted  . HNP (herniated nucleus pulposus), lumbar 05/05/2014    Class: Acute   Past Medical History:  Diagnosis Date  . Arthritis    knee  . Heart murmur    as a child  . Ureteral stricture    as a child    Family History  Problem Relation Age of Onset  . Heart disease Mother   . Hypertension Father     Past Surgical History:  Procedure Laterality Date  . KNEE ARTHROSCOPY W/ MENISCAL REPAIR Left   . LUMBAR LAMINECTOMY N/A 05/05/2014   Procedure: RIGHT L3-4 MICRODISCECTOMY WITH MIS;  Surgeon: Kerrin Champagne, MD;  Location: [facility];  Service: Orthopedics;  Laterality: N/A;  . TONSILLECTOMY    . WISDOM TOOTH EXTRACTION     Social History   Occupational History  . Not on file  Tobacco Use  . Smoking status: Never Smoker  . Smokeless tobacco: Current User    Types: Chew  Substance and Sexual Activity  . Alcohol use: Yes    Comment: occ  . Drug use: No  . Sexual activity: Not on file

## 2018-03-26 ENCOUNTER — Telehealth (INDEPENDENT_AMBULATORY_CARE_PROVIDER_SITE_OTHER): Payer: Self-pay | Admitting: Orthopaedic Surgery

## 2018-03-26 NOTE — Telephone Encounter (Signed)
PLEASE ADVISE.

## 2018-03-26 NOTE — Telephone Encounter (Signed)
Patient called stating he had an appointment on 01/05/18 and had his knee drained and received a cortisone shot.  Patient states Dr. Cleophas Dunker told him to call back if the shot didn't help and he would schedule surgery.   Patient requested a return call.

## 2018-03-29 NOTE — Telephone Encounter (Signed)
Will schedule surgery-blue sheet completed-called pt to discuss

## 2018-04-01 ENCOUNTER — Telehealth (INDEPENDENT_AMBULATORY_CARE_PROVIDER_SITE_OTHER): Payer: Self-pay | Admitting: Orthopaedic Surgery

## 2018-04-01 NOTE — Telephone Encounter (Signed)
Please advise. Thank you

## 2018-04-01 NOTE — Telephone Encounter (Signed)
Patient left a voicemail checking the status of his surgery.

## 2018-04-05 NOTE — Telephone Encounter (Signed)
FYI:  Patient is scheduled for right knee arthroscopy April 15, 2018 @ 10:30

## 2018-04-05 NOTE — Telephone Encounter (Signed)
JUST AN FYI 

## 2018-04-12 NOTE — Telephone Encounter (Signed)
thanks

## 2018-04-22 ENCOUNTER — Encounter: Payer: Self-pay | Admitting: Orthopaedic Surgery

## 2018-04-22 DIAGNOSIS — M23221 Derangement of posterior horn of medial meniscus due to old tear or injury, right knee: Secondary | ICD-10-CM

## 2018-04-22 DIAGNOSIS — M2341 Loose body in knee, right knee: Secondary | ICD-10-CM

## 2018-04-22 DIAGNOSIS — M1711 Unilateral primary osteoarthritis, right knee: Secondary | ICD-10-CM

## 2018-04-23 ENCOUNTER — Inpatient Hospital Stay (INDEPENDENT_AMBULATORY_CARE_PROVIDER_SITE_OTHER): Payer: 59 | Admitting: Orthopaedic Surgery

## 2018-04-26 ENCOUNTER — Encounter (INDEPENDENT_AMBULATORY_CARE_PROVIDER_SITE_OTHER): Payer: Self-pay | Admitting: Orthopaedic Surgery

## 2018-04-26 ENCOUNTER — Ambulatory Visit (INDEPENDENT_AMBULATORY_CARE_PROVIDER_SITE_OTHER): Payer: 59 | Admitting: Orthopaedic Surgery

## 2018-04-26 VITALS — BP 132/93 | HR 60 | Ht 75.0 in | Wt 180.0 lb

## 2018-04-26 DIAGNOSIS — M25561 Pain in right knee: Secondary | ICD-10-CM

## 2018-04-26 DIAGNOSIS — G8929 Other chronic pain: Secondary | ICD-10-CM

## 2018-04-26 NOTE — Progress Notes (Signed)
Office Visit Note   Patient: Brian Merritt           Date of Birth: 12/11/1965           MRN: [id number] Visit Date: 04/26/2018              Requested by: Renford Dills, MD [address] [place], [place] [address] PCP: Renford Dills, MD   Assessment & Plan: Visit Diagnoses:  1. Chronic pain of right knee     Plan: 4 days status post right knee arthroscopy.  Complex tear medial meniscus and some arthritic changes in the medial compartment.  Doing very well with minimal discomfort.  Already back to work plan to see back in 2 weeks.  Activity as tolerated  Follow-Up Instructions: Return in about 2 weeks (around 05/10/2018).   Orders:  No orders of the defined types were placed in this encounter.  No orders of the defined types were placed in this encounter.     Procedures: No procedures performed   Clinical Data: No additional findings.   Subjective: Chief Complaint  Patient presents with  . Post-op Follow-up    04/15/18 RKA NO ISSUES DOING GREAT NO SWELLING OR NUMBNESS  4 days status post right knee arthroscopy for complex tear of the medial meniscus and arthritic changes in the medial compartment.  Doing very well.  Back to work.  Minimal discomfort.  No fever chills  HPI  Review of Systems  Constitutional: Negative for fatigue and fever.  HENT: Negative for ear pain.   Eyes: Negative for pain.  Respiratory: Negative for cough and shortness of breath.   Cardiovascular: Negative for leg swelling.  Gastrointestinal: Negative for constipation and diarrhea.  Genitourinary: Negative for difficulty urinating.  Musculoskeletal: Positive for back pain. Negative for neck pain.  Skin: Negative for rash.  Allergic/Immunologic: Negative for food allergies.  Neurological: Negative for weakness and numbness.  Hematological: Does not bruise/bleed easily.  Psychiatric/Behavioral: Negative for sleep disturbance.     Objective: Vital Signs: BP (!) 132/93  (BP Location: Left Arm, Patient Position: Sitting, Cuff Size: Normal)   Pulse 60   Ht 6\' 3"  (1.905 m)   Wt 180 lb (81.6 kg)   BMI 22.50 kg/m   Physical Exam  Ortho Exam small effusion right knee.  Arthroscopic portals healing without problem.  No redness.  No calf pain.  Neurovascular exam intact.  Full knee extension flexed about 100 degrees  Specialty Comments:  No specialty comments available.  Imaging: No results found.   PMFS History: Patient Active Problem List   Diagnosis Date Noted  . HNP (herniated nucleus pulposus), lumbar 05/05/2014    Class: Acute   Past Medical History:  Diagnosis Date  . Arthritis    knee  . Heart murmur    as a child  . Ureteral stricture    as a child    Family History  Problem Relation Age of Onset  . Heart disease Mother   . Hypertension Father     Past Surgical History:  Procedure Laterality Date  . KNEE ARTHROSCOPY    . KNEE ARTHROSCOPY W/ MENISCAL REPAIR Left   . LUMBAR LAMINECTOMY N/A 05/05/2014   Procedure: RIGHT L3-4 MICRODISCECTOMY WITH MIS;  Surgeon: Kerrin Champagne, MD;  Location: [facility];  Service: Orthopedics;  Laterality: N/A;  . TONSILLECTOMY    . WISDOM TOOTH EXTRACTION     Social History   Occupational History  . Not on file  Tobacco Use  .  Smoking status: Never Smoker  . Smokeless tobacco: Current User    Types: Chew  Substance and Sexual Activity  . Alcohol use: Yes    Comment: occ  . Drug use: No  . Sexual activity: Not on file

## 2018-11-25 ENCOUNTER — Ambulatory Visit
Admission: RE | Admit: 2018-11-25 | Discharge: 2018-11-25 | Disposition: A | Discharge status: Home / Self Care | Payer: BLUE CROSS/BLUE SHIELD | Source: Ambulatory Visit | Attending: Anesthesiology | Admitting: Anesthesiology

## 2018-11-25 ENCOUNTER — Other Ambulatory Visit: Payer: Self-pay

## 2018-11-25 ENCOUNTER — Other Ambulatory Visit: Payer: Self-pay | Admitting: Anesthesiology

## 2018-11-25 DIAGNOSIS — M25552 Pain in left hip: Secondary | ICD-10-CM

## 2018-11-25 DIAGNOSIS — R1032 Left lower quadrant pain: Secondary | ICD-10-CM

## 2018-12-27 ENCOUNTER — Telehealth: Payer: Self-pay | Admitting: Orthopaedic Surgery

## 2018-12-27 NOTE — Telephone Encounter (Signed)
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  Patient is requesting a return call to see if Dr. Durward Fortes has any suggestions on what he should do next.

## 2018-12-28 NOTE — Telephone Encounter (Signed)
called

## 2018-12-28 NOTE — Telephone Encounter (Signed)
Please advise 

## 2018-12-30 ENCOUNTER — Other Ambulatory Visit: Payer: Self-pay

## 2018-12-30 ENCOUNTER — Encounter: Payer: Self-pay | Admitting: Orthopaedic Surgery

## 2018-12-30 ENCOUNTER — Ambulatory Visit (INDEPENDENT_AMBULATORY_CARE_PROVIDER_SITE_OTHER): Payer: BC Managed Care – PPO | Admitting: Orthopaedic Surgery

## 2018-12-30 VITALS — BP 126/81 | HR 72 | Resp 14 | Ht 75.0 in | Wt 185.0 lb

## 2018-12-30 DIAGNOSIS — M25552 Pain in left hip: Secondary | ICD-10-CM

## 2018-12-30 DIAGNOSIS — M1612 Unilateral primary osteoarthritis, left hip: Secondary | ICD-10-CM

## 2018-12-30 NOTE — Progress Notes (Deleted)
   Office Visit Note   Patient: Brian Merritt           Date of Birth: [birth year]           MRN: [id number] Visit Date: 12/30/2018              Requested by: Seward Carol, MD [address] [place],  [place] [address] PCP: Seward Carol, MD   Assessment & Plan: Visit Diagnoses: No diagnosis found.  Plan: ***  Follow-Up Instructions: No follow-ups on file.   Orders:  No orders of the defined types were placed in this encounter.  No orders of the defined types were placed in this encounter.     Procedures: No procedures performed   Clinical Data: No additional findings.   Subjective: Chief Complaint  Patient presents with  . Left Leg - Pain   Brian Merritt is a 53 year old male who presents with left leg pain x 2 months. He has not had an injury. He is under the care of pain management. He if having difficulty walking and sleeping at night.   HPI  Review of Systems   Objective: Vital Signs: BP 126/81 (BP Location: Left Arm, Patient Position: Sitting, Cuff Size: Normal)   Pulse 72   Resp 14   Ht 6\' 3"  (1.905 m)   Wt 185 lb (83.9 kg)   BMI 23.12 kg/m   Physical Exam  Ortho Exam  Specialty Comments:  No specialty comments available.  Imaging: No results found.   PMFS History: Patient Active Problem List   Diagnosis Date Noted  . HNP (herniated nucleus pulposus), lumbar 05/05/2014    Class: Acute   Past Medical History:  Diagnosis Date  . Arthritis    knee  . Heart murmur    as a child  . Ureteral stricture    as a child    Family History  Problem Relation Age of Onset  . Heart disease Mother   . Hypertension Father     Past Surgical History:  Procedure Laterality Date  . KNEE ARTHROSCOPY    . KNEE ARTHROSCOPY W/ MENISCAL REPAIR Left   . LUMBAR LAMINECTOMY N/A 05/05/2014   Procedure: RIGHT L3-4 MICRODISCECTOMY WITH MIS;  Surgeon: Jessy Oto, MD;  Location: [facility];  Service: Orthopedics;  Laterality: N/A;  .  TONSILLECTOMY    . WISDOM TOOTH EXTRACTION     Social History   Occupational History  . Not on file  Tobacco Use  . Smoking status: Never Smoker  . Smokeless tobacco: Current User    Types: Chew  Substance and Sexual Activity  . Alcohol use: Yes    Comment: occ  . Drug use: No  . Sexual activity: Not on file

## 2018-12-30 NOTE — Progress Notes (Signed)
Office Visit Note   Patient: Brian Merritt           Date of Birth: [birth year]           MRN: [id number] Visit Date: 12/30/2018              Requested by: Renford DillsPolite, Ronald, MD [address] [place],  [place] [address] PCP: Renford DillsPolite, Ronald, MD   Assessment & Plan: Visit Diagnoses:  1. Pain in left hip   2. Unilateral primary osteoarthritis, left hip     Plan: Osteoarthritis left hip with typical groin thigh and lateral hip pain.  Long discussion over 30 minutes regarding diagnosis and treatment options.  He like to initially try NSAIDs and an intra-articular cortisone injection.  Have discussed hip replacement at length.  Has been involved in a pain clinic for a long time related to his back.  He could check with pain clinic physician regarding stronger medicine  Follow-Up Instructions: Return if symptoms worsen or fail to improve.   Orders:  Orders Placed This Encounter  Procedures  . Ambulatory referral to Physical Medicine Rehab   No orders of the defined types were placed in this encounter.     Procedures: No procedures performed   Clinical Data: No additional findings.   Subjective: Chief Complaint  Patient presents with  . Left Leg - Pain  Brian Merritt relates a several month history of insidious onset left groin lateral hip and left thigh pain.  He was seen by his primary care physician with x-rays consistent with osteoarthritis.  He does have a history of psoriatic arthritis which may or may not be contributing to the arthritic changes.  He also was involved in a chronic pain clinic for his back with Dr. Vear ClockPhillips.  He is tried anti-inflammatory medicines which gives him some temporary relief of pain.  He is not had any numbness or tingling in his legs.  The pain is fairly well localized to the groin lateral hip and anterior left thigh.  Pain compromises activities and even sleep.  He has had x-rays which I reviewed on the PACS system.  He has osteophyte  formation along the peripheral aspect of the femoral head both superiorly and inferiorly associated with subchondral sclerosis and subchondral cyst particularly in the superior femoral head.  Space is quite narrowed at that same level  HPI  Review of Systems   Objective: Vital Signs: BP 126/81 (BP Location: Left Arm, Patient Position: Sitting, Cuff Size: Normal)   Pulse 72   Resp 14   Ht 6\' 3"  (1.905 m)   Wt 185 lb (83.9 kg)   BMI 23.12 kg/m   Physical Exam Constitutional:      Appearance: He is well-developed.  Eyes:     Pupils: Pupils are equal, round, and reactive to light.  Pulmonary:     Effort: Pulmonary effort is normal.  Skin:    General: Skin is warm and dry.  Neurological:     Mental Status: He is alert and oriented to person, place, and time.  Psychiatric:        Behavior: Behavior normal.     Ortho Exam walks with a limp referable to his left hip.  Limited range of motion with internal and external rotation of left hip.  Straight leg raise negative.  No localized areas of thigh or lateral hip pain.  Has about 20 degrees of total motion with about 10 degrees of internal and external rotation from neutral position.  No distal edema.  No percussible back pain.  Specialty Comments:  No specialty comments available.  Imaging: No results found.   PMFS History: Patient Active Problem List   Diagnosis Date Noted  . Unilateral primary osteoarthritis, left hip 12/30/2018  . HNP (herniated nucleus pulposus), lumbar 05/05/2014    Class: Acute   Past Medical History:  Diagnosis Date  . Arthritis    knee  . Heart murmur    as a child  . Ureteral stricture    as a child    Family History  Problem Relation Age of Onset  . Heart disease Mother   . Hypertension Father     Past Surgical History:  Procedure Laterality Date  . KNEE ARTHROSCOPY    . KNEE ARTHROSCOPY W/ MENISCAL REPAIR Left   . LUMBAR LAMINECTOMY N/A 05/05/2014   Procedure: RIGHT L3-4  MICRODISCECTOMY WITH MIS;  Surgeon: Jessy Oto, MD;  Location: [facility];  Service: Orthopedics;  Laterality: N/A;  . TONSILLECTOMY    . WISDOM TOOTH EXTRACTION     Social History   Occupational History  . Not on file  Tobacco Use  . Smoking status: Never Smoker  . Smokeless tobacco: Current User    Types: Chew  Substance and Sexual Activity  . Alcohol use: Yes    Comment: occ  . Drug use: No  . Sexual activity: Not on file     Garald Balding, MD   Note - This record has been created using [employer].  Chart creation errors have been sought, but may not always  have been located. Such creation errors do not reflect on  the standard of medical care.

## 2019-01-19 ENCOUNTER — Other Ambulatory Visit: Payer: Self-pay

## 2019-01-19 ENCOUNTER — Encounter: Payer: Self-pay | Admitting: Physical Medicine and Rehabilitation

## 2019-01-19 ENCOUNTER — Ambulatory Visit: Payer: BC Managed Care – PPO

## 2019-01-19 ENCOUNTER — Ambulatory Visit (INDEPENDENT_AMBULATORY_CARE_PROVIDER_SITE_OTHER): Payer: BC Managed Care – PPO | Admitting: Physical Medicine and Rehabilitation

## 2019-01-19 DIAGNOSIS — M25552 Pain in left hip: Secondary | ICD-10-CM | POA: Diagnosis not present

## 2019-01-19 NOTE — Progress Notes (Signed)
   Brian Merritt - 53 y.o. male MRN [id number]  Date of birth: 1965-07-13  Office Visit Note: Visit Date: 01/19/2019 PCP: Seward Carol, MD Referred by: Seward Carol, MD  Subjective: Chief Complaint  Patient presents with  . Left Hip - Pain  . Left Thigh - Pain   HPI:  Brian Merritt is a 53 y.o. male who comes in today At the request Dr. Joni Fears forgot [place] and hopefully therapeutic anesthetic hip arthrogram on the left.  Patient's been having chronic worsening left hip and groin and thigh pain consistent with hip pathology.  X-rays do show arthritis of the hip.  Patient is also in chronic pain management with Dr. Hardin Negus for his low back.  ROS Otherwise per HPI.  Assessment & Plan: Visit Diagnoses:  1. Pain in left hip     Plan: Findings:  Patient did appear to have some relief during the anesthetic phase of the injection.    Meds & Orders: No orders of the defined types were placed in this encounter.   Orders Placed This Encounter  Procedures  . Large Joint Inj: L hip joint  . XR C-ARM NO REPORT    Follow-up: No follow-ups on file.   Procedures: Large Joint Inj: L hip joint on 01/19/2019 2:11 PM Indications: pain and diagnostic evaluation Details: 22 G needle, anterior approach  Arthrogram: Yes  Medications: 4 mL bupivacaine 0.25 %; 80 mg triamcinolone acetonide 40 MG/ML Outcome: tolerated well, no immediate complications  Arthrogram demonstrated excellent flow of contrast throughout the joint surface without extravasation or obvious defect.  The patient had relief of symptoms during the anesthetic phase of the injection.  Procedure, treatment alternatives, risks and benefits explained, specific risks discussed. Consent was given by the patient. Immediately prior to procedure a time out was called to verify the correct patient, procedure, equipment, support staff and site/side marked as required. Patient was prepped and draped in the usual sterile  fashion.      No notes on file   Clinical History: No specialty comments available.     Objective:  VS:  HT:    WT:   BMI:     BP:   HR: bpm  TEMP: ( )  RESP:  Physical Exam  Ortho Exam Imaging: No results found.

## 2019-01-19 NOTE — Progress Notes (Signed)
 .  Numeric Pain Rating Scale and Functional Assessment Average Pain 9   In the last MONTH (on 0-10 scale) has pain interfered with the following?  1. General activity like being  able to carry out your everyday physical activities such as walking, climbing stairs, carrying groceries, or moving a chair?  Rating(6)   -Dye Allergies.  

## 2019-01-24 MED ORDER — TRIAMCINOLONE ACETONIDE 40 MG/ML IJ SUSP
80.0000 mg | INTRAMUSCULAR | Status: AC | PRN
  Administered 2019-01-19: 80 mg via INTRA_ARTICULAR

## 2019-01-24 MED ORDER — BUPIVACAINE HCL 0.25 % IJ SOLN
4.0000 mL | INTRAMUSCULAR | Status: AC | PRN
  Administered 2019-01-19: 14:00:00 4 mL via INTRA_ARTICULAR

## 2020-04-10 ENCOUNTER — Other Ambulatory Visit: Payer: Self-pay

## 2020-04-10 ENCOUNTER — Encounter: Payer: Self-pay | Admitting: Orthopaedic Surgery

## 2020-04-10 ENCOUNTER — Ambulatory Visit: Payer: BC Managed Care – PPO | Admitting: Orthopaedic Surgery

## 2020-04-10 ENCOUNTER — Ambulatory Visit: Payer: Self-pay

## 2020-04-10 VITALS — Ht 75.0 in | Wt 185.0 lb

## 2020-04-10 DIAGNOSIS — M1612 Unilateral primary osteoarthritis, left hip: Secondary | ICD-10-CM

## 2020-04-10 NOTE — Progress Notes (Signed)
Office Visit Note   Patient: Brian Merritt           Date of Birth: 09/06/1965           MRN: [id number] Visit Date: 04/10/2020              Requested by: Renford Dills, MD [address] [place],  [place] [address] PCP: Renford Dills, MD   Assessment & Plan: Visit Diagnoses:  1. Primary osteoarthritis of left hip   2. Unilateral primary osteoarthritis, left hip     Plan:  #1: At this time are going to have him make an appointment to see Dr. Allie Bossier for evaluation for anterior total hip replacement. #2: He can follow back up with him in the near future. #3: We have explained to him the fact that his degenerative changes and bone cyst in the femoral head have increased.  He appears to be understanding of this.   Follow-Up Instructions: No follow-ups on file.   Face-to-face time spent with patient was greater than 30 minutes.  Greater than 50% of the time was spent in counseling and coordination of care.  Orders:  Orders Placed This Encounter  Procedures  . XR HIP UNILAT W OR W/O PELVIS 2-3 VIEWS LEFT   No orders of the defined types were placed in this encounter.     Procedures: No procedures performed   Clinical Data: No additional findings.   Subjective: Chief Complaint  Patient presents with  . Left Hip - Pain    HPI: Patient presents today for left hip pain. He has had continued pain in his groin and all throughout his hip. He has pain that radiates down his leg. He gets sharp pains that then cause his leg to give way. He does have lower back pain, but is seeing pain management for this. No numbness or tingling in his lower legs. He had an injection with Dr.Newton in July of 2020 and states that it helped. He received another injection with Dr.Phillips later in the year and states that it did not help. He wants to talk about his options today.   His injection back in July 2020 did have a benefit at that time but over the past several  months he is having worsening pain in his groin radiating to his knee also having pain in his back from his hip.  Also back down the posterior aspect of his leg which is being treated by pain management at this time.  He denies any numbness or tingling.  Seen today for reevaluation.   Review of Systems  Constitutional: Negative for fatigue.  HENT: Negative for ear pain.   Eyes: Negative for pain.  Respiratory: Negative for shortness of breath.   Cardiovascular: Positive for leg swelling.  Gastrointestinal: Negative for constipation.  Endocrine: Negative for cold intolerance and heat intolerance.  Genitourinary: Negative for difficulty urinating.  Musculoskeletal: Negative for joint swelling.  Skin: Negative for rash.  Allergic/Immunologic: Negative for food allergies.  Neurological: Positive for weakness.  Hematological: Does not bruise/bleed easily.  Psychiatric/Behavioral: Negative for sleep disturbance.     Objective: Vital Signs: Ht 6\' 3"  (1.905 m)   Wt 185 lb (83.9 kg)   BMI 23.12 kg/m   Physical Exam Constitutional:      Appearance: Normal appearance. He is well-developed.  HENT:     Head: Normocephalic.  Eyes:     Pupils: Pupils are equal, round, and reactive to light.  Pulmonary:  Effort: Pulmonary effort is normal.  Skin:    General: Skin is warm and dry.  Neurological:     Mental Status: He is alert and oriented to person, place, and time.  Psychiatric:        Behavior: Behavior normal.     Ortho Exam  Exam today reveals pain with motion of the left hip.  He is limited internal and external rotation as well as forward flexion and extension.  Specialty Comments:  No specialty comments available.  Imaging: No results found.   PMFS History: Current Outpatient Medications  Medication Sig Dispense Refill  . methocarbamol (ROBAXIN) 500 MG tablet Take 1 tablet (500 mg total) by mouth every 6 (six) hours as needed for muscle spasms (spasm). 30 tablet 0    . Multiple Vitamins-Minerals (ONE DAILY MENS) TABS Take 1 tablet by mouth daily.    Marland Kitchen oxyCODONE (XTAMPZA ER PO) Take by mouth.    . Oxycodone HCl 20 MG TABS Take 20 mg by mouth 2 (two) times daily as needed (pain).    Marland Kitchen oxyCODONE-acetaminophen (ROXICET) 5-325 MG per tablet Take 1 tablet by mouth every 4 (four) hours as needed. 60 tablet 0  . traMADol (ULTRAM) 50 MG tablet 1-2 TABS BID PRN 30 tablet 0   No current facility-administered medications for this visit.    Patient Active Problem List   Diagnosis Date Noted  . Unilateral primary osteoarthritis, left hip 12/30/2018  . HNP (herniated nucleus pulposus), lumbar 05/05/2014    Class: Acute   Past Medical History:  Diagnosis Date  . Arthritis    knee  . Heart murmur    as a child  . Ureteral stricture    as a child    Family History  Problem Relation Age of Onset  . Heart disease Mother   . Hypertension Father     Past Surgical History:  Procedure Laterality Date  . KNEE ARTHROSCOPY    . KNEE ARTHROSCOPY W/ MENISCAL REPAIR Left   . LUMBAR LAMINECTOMY N/A 05/05/2014   Procedure: RIGHT L3-4 MICRODISCECTOMY WITH MIS;  Surgeon: Kerrin Champagne, MD;  Location: [facility] OR;  Service: Orthopedics;  Laterality: N/A;  . TONSILLECTOMY    . WISDOM TOOTH EXTRACTION     Social History   Occupational History  . Not on file  Tobacco Use  . Smoking status: Never Smoker  . Smokeless tobacco: Current User    Types: Chew  Vaping Use  . Vaping Use: Never used  Substance and Sexual Activity  . Alcohol use: Yes    Comment: occ  . Drug use: No  . Sexual activity: Not on file

## 2020-04-18 ENCOUNTER — Encounter: Payer: Self-pay | Admitting: Orthopaedic Surgery

## 2020-04-18 ENCOUNTER — Ambulatory Visit (INDEPENDENT_AMBULATORY_CARE_PROVIDER_SITE_OTHER): Payer: BC Managed Care – PPO | Admitting: Orthopaedic Surgery

## 2020-04-18 VITALS — Ht 74.0 in | Wt 210.0 lb

## 2020-04-18 DIAGNOSIS — M1612 Unilateral primary osteoarthritis, left hip: Secondary | ICD-10-CM | POA: Diagnosis not present

## 2020-04-18 NOTE — Progress Notes (Signed)
Office Visit Note   Patient: Brian Merritt           Date of Birth: 10-19-1965           MRN: [id number] Visit Date: 04/18/2020              Requested by: Renford Dills, MD [address] [place],  [place] [address] PCP: Renford Dills, MD   Assessment & Plan: Visit Diagnoses:  1. Unilateral primary osteoarthritis, left hip     Plan: At this point I agree with the need for hip replacement surgery given his x-ray findings and clinical exam findings.  I explained in detail what the surgery involves.  I gave him a handout on hip replacement surgery.  I showed him a hip model and described in detail the risk and benefits of the surgery as well as described the interoperative and postoperative course.  All question concerns were answered and addressed.  We will work on getting the schedule soon.  He understands that there are some delays given the COVID-19 pandemic and there is a slight backlog of cases.  We will do we can get him scheduled before the end of the year.  Follow-Up Instructions: Return for 2 weeks post-op.   Orders:  No orders of the defined types were placed in this encounter.  No orders of the defined types were placed in this encounter.     Procedures: No procedures performed   Clinical Data: No additional findings.   Subjective: Chief Complaint  Patient presents with  . Left Hip - Pain  The patient is a 54 year old active gentleman who has been having worsening left hip pain for 2 years now.  He has seen my partner Dr. Cleophas Dunker who is referred him to me to consider direct anterior hip replacement surgery for this patient.  He is very active.  Denies injury to the hip.  This is been getting worse for 2 years now and is now definitely affecting his mobility, his quality of life, and his actives daily living.  His pain is 10 out of 10 and daily.  He walks with a significant limp as well.  He has tried failed all forms conservative treatment for over  2 years.  This includes hip strengthening exercises as well as activity modifications.  He is a runner.  He is thin.  He has tried anti-inflammatories as well and it is gotten to the point where he would like to proceed with an operative intervention given the severity of his pain.  He is otherwise an active individual with no acute medical issues.  He is not a diabetic and not a smoker.  HPI  Review of Systems He currently denies any headache, chest pain, shortness of breath, fever, chills, nausea, vomiting  Objective: Vital Signs: Ht 6\' 2"  (1.88 m)   Wt 210 lb (95.3 kg)   BMI 26.96 kg/m   Physical Exam He is alert and orient x3 and in no acute distress Ortho Exam Examination of his left hip shows it is severely stiff with any attempts of internal and external rotation causes severe pain in the groin.  He has trouble crossing his leg on that side and putting his shoe and sock on on the left side.  His right hip exam is entirely normal. Specialty Comments:  No specialty comments available.  Imaging: No results found. X-rays on the canopy system show a normal-appearing right hip.  The left hip has complete loss of joint  space.  There is flattening of the femoral head.  There is actually cystic changes in the femoral head and acetabulum showing osteoarthritis with superimposed likely osteonecrosis.  There are large para-articular osteophytes as well.  PMFS History: Patient Active Problem List   Diagnosis Date Noted  . Unilateral primary osteoarthritis, left hip 12/30/2018  . HNP (herniated nucleus pulposus), lumbar 05/05/2014    Class: Acute   Past Medical History:  Diagnosis Date  . Arthritis    knee  . Heart murmur    as a child  . Ureteral stricture    as a child    Family History  Problem Relation Age of Onset  . Heart disease Mother   . Hypertension Father     Past Surgical History:  Procedure Laterality Date  . KNEE ARTHROSCOPY    . KNEE ARTHROSCOPY W/ MENISCAL  REPAIR Left   . LUMBAR LAMINECTOMY N/A 05/05/2014   Procedure: RIGHT L3-4 MICRODISCECTOMY WITH MIS;  Surgeon: Kerrin Champagne, MD;  Location: [facility] OR;  Service: Orthopedics;  Laterality: N/A;  . TONSILLECTOMY    . WISDOM TOOTH EXTRACTION     Social History   Occupational History  . Not on file  Tobacco Use  . Smoking status: Never Smoker  . Smokeless tobacco: Current User    Types: Chew  Vaping Use  . Vaping Use: Never used  Substance and Sexual Activity  . Alcohol use: Yes    Comment: occ  . Drug use: No  . Sexual activity: Not on file

## 2020-05-21 ENCOUNTER — Other Ambulatory Visit: Payer: Self-pay

## 2020-06-11 ENCOUNTER — Other Ambulatory Visit: Payer: Self-pay | Admitting: Physician Assistant

## 2020-06-14 NOTE — Pre-Procedure Instructions (Signed)
Your procedure is scheduled on Tues., Dec. 21, 2021 from 9:45AM-11:30AM  Report to [facility] Entrance "A" at 7:45AM  Call this number if you have problems the morning of surgery:  [phone]   Remember:  Do not eat after midnight on Dec. 20th  You may drink clear liquids until 3 hours (6:45AM) prior to surgery time .  Clear liquids allowed are: Water, Black Coffee Only (no dairy or creamer), Clear Tea (no dairy or creamer), Carbonated Beverages, Gatorade/Powerade, Plain Jell-O, Plain Popsicles  Enhanced Recovery after Surgery for Orthopedics Enhanced Recovery after Surgery is a protocol used to improve the stress on your body and your recovery after surgery.  Patient Instructions .  Marland Kitchen The day of surgery (if you do NOT have diabetes):  o Drink ONE (1) Pre-Surgery Clear Ensure by __6:45AM___  the morning of surgery   o This drink was given to you during your hospital  pre-op appointment visit. o Nothing else to drink after completing the  Pre-Surgery Clear Ensure.        If you have questions, please contact your surgeon's office.    Take these medicines the morning of surgery with A SIP OF WATER: OxyCODONE ER  As of today, STOP taking all Aspirin (unless instructed by your doctor) and Other Aspirin containing products, Vitamins, Fish oils, and Herbal medications. Also stop all NSAIDS i.e. Advil, Ibuprofen, Motrin, Aleve, Anaprox, Naproxen, BC, Goody Powders, and all Supplements.   No Smoking of any kind, Tobacco/Vaping, or Alcohol products 24 hours prior to your procedure. If you use a Cpap at night, you may bring all equipment for your overnight stay.    Special instructions:   [place]- Preparing For Surgery  Before surgery, you can play an important role. Because skin is not sterile, your skin needs to be as free of germs as possible. You can reduce the number of germs on your skin by washing with CHG (chlorahexidine gluconate) Soap before surgery.  CHG is an  antiseptic cleaner which kills germs and bonds with the skin to continue killing germs even after washing.    Please do not use if you have an allergy to CHG or antibacterial soaps. If your skin becomes reddened/irritated stop using the CHG.  Do not shave (including legs and underarms) for at least 48 hours prior to first CHG shower. It is OK to shave your face.  Please follow these instructions carefully.   1. Shower the NIGHT BEFORE SURGERY and the MORNING OF SURGERY with CHG.   2. If you chose to wash your hair, wash your hair first as usual with your normal shampoo.  3. After you shampoo, rinse your hair and body thoroughly to remove the shampoo.  4. Use CHG as you would any other liquid soap. You can apply CHG directly to the skin and wash gently with a scrungie or a clean washcloth.   5. Apply the CHG Soap to your body ONLY FROM THE NECK DOWN.  Do not use on open wounds or open sores. Avoid contact with your eyes, ears, mouth and genitals (private parts). Wash Face and genitals (private parts)  with your normal soap.  6. Wash thoroughly, paying special attention to the area where your surgery will be performed.  7. Thoroughly rinse your body with warm water from the neck down.  8. DO NOT shower/wash with your normal soap after using and rinsing off the CHG Soap.  9. Pat yourself dry with a CLEAN TOWEL.  10. Wear  CLEAN PAJAMAS to bed the night before surgery, wear comfortable clothes the morning of surgery  11. Place CLEAN SHEETS on your bed the night of your first shower and DO NOT SLEEP WITH PETS.   Day of Surgery:             Remember to brush your teeth WITH YOUR REGULAR TOOTHPASTE.  Do not wear jewelry.  Do not wear lotions, powders, colognes, or deodorant.  Do not shave 48 hours prior to surgery.  Men may shave face and neck.  Do not bring valuables to the hospital.  [facility] is not responsible for any belongings or valuables.  Contacts, dentures or bridgework may  not be worn into surgery.    For patients admitted to the hospital, discharge time will be determined by your treatment team.  Patients discharged the day of surgery will not be allowed to drive home, and someone age 43 and over needs to stay with them for 24 hours.  Please wear clean clothes to the hospital/surgery center.    Please read over the following fact sheets that you were given.

## 2020-06-15 ENCOUNTER — Other Ambulatory Visit: Payer: Self-pay

## 2020-06-15 ENCOUNTER — Encounter (HOSPITAL_COMMUNITY)
Admission: RE | Admit: 2020-06-15 | Discharge: 2020-06-15 | Disposition: A | Discharge status: Home / Self Care | Payer: BC Managed Care – PPO | Source: Ambulatory Visit | Attending: Orthopaedic Surgery | Admitting: Orthopaedic Surgery

## 2020-06-15 ENCOUNTER — Encounter (HOSPITAL_COMMUNITY): Payer: Self-pay

## 2020-06-15 ENCOUNTER — Other Ambulatory Visit (HOSPITAL_COMMUNITY)
Admission: RE | Admit: 2020-06-15 | Discharge: 2020-06-15 | Disposition: A | Discharge status: Home / Self Care | Payer: BC Managed Care – PPO | Source: Ambulatory Visit | Attending: Orthopaedic Surgery | Admitting: Orthopaedic Surgery

## 2020-06-15 DIAGNOSIS — R011 Cardiac murmur, unspecified: Secondary | ICD-10-CM | POA: Diagnosis not present

## 2020-06-15 DIAGNOSIS — Z01818 Encounter for other preprocedural examination: Secondary | ICD-10-CM | POA: Insufficient documentation

## 2020-06-15 DIAGNOSIS — Z01812 Encounter for preprocedural laboratory examination: Secondary | ICD-10-CM | POA: Diagnosis not present

## 2020-06-15 DIAGNOSIS — I10 Essential (primary) hypertension: Secondary | ICD-10-CM | POA: Insufficient documentation

## 2020-06-15 DIAGNOSIS — Z20822 Contact with and (suspected) exposure to covid-19: Secondary | ICD-10-CM | POA: Insufficient documentation

## 2020-06-15 LAB — TYPE AND SCREEN
ABO/RH(D): O POS
Antibody Screen: NEGATIVE

## 2020-06-15 LAB — CBC
HCT: 42.3 % (ref 39.0–52.0)
Hemoglobin: 14 g/dL (ref 13.0–17.0)
MCH: 32.2 pg (ref 26.0–34.0)
MCHC: 33.1 g/dL (ref 30.0–36.0)
MCV: 97.2 fL (ref 80.0–100.0)
Platelets: 259 10*3/uL (ref 150–400)
RBC: 4.35 MIL/uL (ref 4.22–5.81)
RDW: 14.6 % (ref 11.5–15.5)
WBC: 5.3 10*3/uL (ref 4.0–10.5)
nRBC: 0 % (ref 0.0–0.2)

## 2020-06-15 LAB — BASIC METABOLIC PANEL
Anion gap: 9 (ref 5–15)
BUN: 7 mg/dL (ref 6–20)
CO2: 24 mmol/L (ref 22–32)
Calcium: 9.3 mg/dL (ref 8.9–10.3)
Chloride: 103 mmol/L (ref 98–111)
Creatinine, Ser: 0.81 mg/dL (ref 0.61–1.24)
GFR, Estimated: >60 mL/min (ref >60)
Glucose, Bld: 86 mg/dL (ref 70–99)
Potassium: 3.6 mmol/L (ref 3.5–5.1)
Sodium: 136 mmol/L (ref 135–145)

## 2020-06-15 LAB — SURGICAL PCR SCREEN
MRSA, PCR: NEGATIVE
Staphylococcus aureus: NEGATIVE

## 2020-06-15 LAB — SARS CORONAVIRUS 2 (TAT 6-24 HRS): SARS Coronavirus 2: NEGATIVE

## 2020-06-15 NOTE — Progress Notes (Signed)
PCP - Dr. Nehemiah Settle Cardiologist - denies  Chest x-ray - n/a EKG - 06/15/20 Stress Test - denies ECHO - denies Cardiac Cath - denies  ERAS Protcol - yes PRE-SURGERY Ensure or G2- ensure  COVID TEST- 06/15/20   Anesthesia review: n/a  Pt had heart murmur as a child. No cardiologist, no cardiac issues.   Patient denies shortness of breath, fever, cough and chest pain at PAT appointment   All instructions explained to the patient, with a verbal understanding of the material. Patient agrees to go over the instructions while at home for a better understanding. Patient also instructed to self quarantine after being tested for COVID-19. The opportunity to ask questions was provided.

## 2020-06-18 NOTE — Anesthesia Preprocedure Evaluation (Addendum)
Anesthesia Evaluation  Patient identified by MRN, date of birth, ID band Patient awake    Reviewed: Allergy & Precautions, NPO status , Patient's Chart, lab work & pertinent test results  Airway Mallampati: II  TM Distance: >3 FB Neck ROM: Full    Dental  (+) Dental Advisory Given   Pulmonary neg pulmonary ROS,    breath sounds clear to auscultation       Cardiovascular Exercise Tolerance: Good  Rhythm:Regular Rate:Normal     Neuro/Psych negative neurological ROS  negative psych ROS   GI/Hepatic negative GI ROS, Neg liver ROS,   Endo/Other  negative endocrine ROS  Renal/GU negative Renal ROSK+ 3.6     Musculoskeletal  (+) Arthritis , S/P L3_L4 microdiskectomy   Abdominal   Peds  Hematology Hgb 14.0 Plt 259   Anesthesia Other Findings ALL : PCn, Advil  Reproductive/Obstetrics                            Lab Results  Component Value Date   WBC 5.3 06/15/2020   HGB 14.0 06/15/2020   HCT 42.3 06/15/2020   MCV 97.2 06/15/2020   PLT 259 06/15/2020   Lab Results  Component Value Date   CREATININE 0.81 06/15/2020   BUN 7 06/15/2020   NA 136 06/15/2020   K 3.6 06/15/2020   CL 103 06/15/2020   CO2 24 06/15/2020    Anesthesia Physical Anesthesia Plan  ASA: II  Anesthesia Plan: Spinal   Post-op Pain Management:    Induction:   PONV Risk Score and Plan: 2 and Treatment may vary due to age or medical condition, Ondansetron and Propofol infusion  Airway Management Planned: Nasal Cannula and Natural Airway  Additional Equipment: None  Intra-op Plan:   Post-operative Plan:   Informed Consent: I have reviewed the patients History and Physical, chart, labs and discussed the procedure including the risks, benefits and alternatives for the proposed anesthesia with the patient or authorized representative who has indicated his/her understanding and acceptance.     Dental advisory  given  Plan Discussed with: CRNA and Anesthesiologist  Anesthesia Plan Comments: (Sp for [facility])       Anesthesia Quick Evaluation

## 2020-06-18 NOTE — H&P (Signed)
TOTAL HIP ADMISSION H&P  Patient is admitted for left total hip arthroplasty.  Subjective:  Chief Complaint: left hip pain  HPI: Brian Merritt, 54 y.o. male, has a history of pain and functional disability in the left hip(s) due to arthritis and patient has failed non-surgical conservative treatments for greater than 12 weeks to include NSAID's and/or analgesics, corticosteriod injections, viscosupplementation injections, flexibility and strengthening excercises, use of assistive devices and activity modification.  Onset of symptoms was gradual starting 2 years ago with gradually worsening course since that time.The patient noted no past surgery on the left hip(s).  Patient currently rates pain in the left hip at 10 out of 10 with activity. Patient has night pain, worsening of pain with activity and weight bearing, pain that interfers with activities of daily living and pain with passive range of motion. Patient has evidence of subchondral cysts, subchondral sclerosis, periarticular osteophytes and joint space narrowing by imaging studies. This condition presents safety issues increasing the risk of falls.  There is no current active infection.  Patient Active Problem List   Diagnosis Date Noted  . Unilateral primary osteoarthritis, left hip 12/30/2018  . HNP (herniated nucleus pulposus), lumbar 05/05/2014   Past Medical History:  Diagnosis Date  . Arthritis    knee  . Heart murmur    as a child  . Ureteral stricture    as a child    Past Surgical History:  Procedure Laterality Date  . KNEE ARTHROSCOPY    . KNEE ARTHROSCOPY W/ MENISCAL REPAIR Left   . LUMBAR LAMINECTOMY N/A 05/05/2014   Procedure: RIGHT L3-4 MICRODISCECTOMY WITH MIS;  Surgeon: Kerrin Champagne, MD;  Location: [facility] OR;  Service: Orthopedics;  Laterality: N/A;  . TONSILLECTOMY    . WISDOM TOOTH EXTRACTION      No current facility-administered medications for this encounter.   Current Outpatient Medications  Medication  Sig Dispense Refill Last Dose  . Multiple Vitamins-Minerals (ONE DAILY MENS) TABS Take 1 tablet by mouth daily.     Marland Kitchen oxyCODONE (OXY IR/ROXICODONE) 5 MG immediate release tablet Take 5 mg by mouth 2 (two) times daily as needed (pain).     Marland Kitchen oxyCODONE ER 9 MG C12A Take 9 mg by mouth in the morning and at bedtime.      Allergies  Allergen Reactions  . Advil [Ibuprofen] Hives    Hives, swelling  Can take Motrin, but usually takes tylenol or aleve  . Penicillins Hives    Social History   Tobacco Use  . Smoking status: Never Smoker  . Smokeless tobacco: Current User    Types: Chew  Substance Use Topics  . Alcohol use: Not Currently    Comment: occ    Family History  Problem Relation Age of Onset  . Heart disease Mother   . Hypertension Father      Review of Systems  All other systems reviewed and are negative.   Objective:  Physical Exam Vitals reviewed.  Constitutional:      Appearance: Normal appearance.  HENT:     Head: Normocephalic and atraumatic.  Eyes:     Extraocular Movements: Extraocular movements intact.     Pupils: Pupils are equal, round, and reactive to light.  Cardiovascular:     Rate and Rhythm: Normal rate and regular rhythm.  Pulmonary:     Effort: Pulmonary effort is normal.     Breath sounds: Normal breath sounds.  Abdominal:     Palpations: Abdomen is soft.  Musculoskeletal:  Cervical back: Normal range of motion and neck supple.     Left hip: Tenderness and bony tenderness present. Decreased range of motion. Decreased strength.  Neurological:     Mental Status: He is alert and oriented to person, place, and time.  Psychiatric:        Behavior: Behavior normal.     Vital signs in last 24 hours:    Labs:   Estimated body mass index is 27.67 kg/m as calculated from the following:   Height as of 06/15/20: 6\' 2"  (1.88 m).   Weight as of 06/15/20: 97.8 kg.   Imaging Review Plain radiographs demonstrate severe degenerative joint  disease of the left hip(s). The bone quality appears to be good for age and reported activity level.      Assessment/Plan:  End stage arthritis, left hip(s)  The patient history, physical examination, clinical judgement of the provider and imaging studies are consistent with end stage degenerative joint disease of the left hip(s) and total hip arthroplasty is deemed medically necessary. The treatment options including medical management, injection therapy, arthroscopy and arthroplasty were discussed at length. The risks and benefits of total hip arthroplasty were presented and reviewed. The risks due to aseptic loosening, infection, stiffness, dislocation/subluxation,  thromboembolic complications and other imponderables were discussed.  The patient acknowledged the explanation, agreed to proceed with the plan and consent was signed. Patient is being admitted for inpatient treatment for surgery, pain control, PT, OT, prophylactic antibiotics, VTE prophylaxis, progressive ambulation and ADL's and discharge planning.The patient is planning to be discharged home with home health services

## 2020-06-19 ENCOUNTER — Ambulatory Visit (HOSPITAL_COMMUNITY): Payer: BC Managed Care – PPO | Admitting: Vascular Surgery

## 2020-06-19 ENCOUNTER — Encounter (HOSPITAL_COMMUNITY): Payer: Self-pay | Admitting: Orthopaedic Surgery

## 2020-06-19 ENCOUNTER — Observation Stay (HOSPITAL_COMMUNITY)
Admission: RE | Admit: 2020-06-19 | Discharge: 2020-06-20 | Disposition: A | Discharge status: Home / Self Care | Payer: BC Managed Care – PPO | Source: Ambulatory Visit | Attending: Orthopaedic Surgery | Admitting: Orthopaedic Surgery

## 2020-06-19 ENCOUNTER — Other Ambulatory Visit: Payer: Self-pay

## 2020-06-19 ENCOUNTER — Observation Stay (HOSPITAL_COMMUNITY): Payer: BC Managed Care – PPO

## 2020-06-19 ENCOUNTER — Ambulatory Visit (HOSPITAL_COMMUNITY): Payer: BC Managed Care – PPO

## 2020-06-19 ENCOUNTER — Encounter (HOSPITAL_COMMUNITY)
Admission: RE | Disposition: A | Discharge status: Home / Self Care | Payer: Self-pay | Source: Ambulatory Visit | Attending: Orthopaedic Surgery

## 2020-06-19 ENCOUNTER — Ambulatory Visit (HOSPITAL_COMMUNITY): Payer: BC Managed Care – PPO | Admitting: Anesthesiology

## 2020-06-19 DIAGNOSIS — Z96642 Presence of left artificial hip joint: Secondary | ICD-10-CM

## 2020-06-19 DIAGNOSIS — M1612 Unilateral primary osteoarthritis, left hip: Principal | ICD-10-CM | POA: Diagnosis present

## 2020-06-19 DIAGNOSIS — Z419 Encounter for procedure for purposes other than remedying health state, unspecified: Secondary | ICD-10-CM

## 2020-06-19 HISTORY — PX: TOTAL HIP ARTHROPLASTY: SHX124

## 2020-06-19 LAB — ABO/RH: ABO/RH(D): O POS

## 2020-06-19 SURGERY — ARTHROPLASTY, HIP, TOTAL, ANTERIOR APPROACH
Anesthesia: Spinal | Site: Hip | Laterality: Left

## 2020-06-19 MED ORDER — ORAL CARE MOUTH RINSE: 15.0000 mL | Freq: Once | OROMUCOSAL | Status: AC

## 2020-06-19 MED ORDER — ACETAMINOPHEN 10 MG/ML IV SOLN
1000.0000 mg | Freq: Once | INTRAVENOUS | Status: DC | PRN
Start: 2020-06-19 — End: 2020-06-19

## 2020-06-19 MED ORDER — PROMETHAZINE HCL 25 MG/ML IJ SOLN: 6.2500 mg | INTRAMUSCULAR | Status: DC | PRN

## 2020-06-19 MED ORDER — OXYCODONE HCL 5 MG PO TABS
5.0000 mg | ORAL_TABLET | ORAL | Status: DC | PRN
  Administered 2020-06-19 – 2020-06-20 (×5): 10 mg via ORAL
  Filled 2020-06-19 (×5): qty 2

## 2020-06-19 MED ORDER — DIPHENHYDRAMINE HCL 12.5 MG/5ML PO ELIX
12.5000 mg | ORAL_SOLUTION | ORAL | Status: DC | PRN
  Filled 2020-06-19: qty 10

## 2020-06-19 MED ORDER — METHOCARBAMOL 500 MG PO TABS
500.0000 mg | ORAL_TABLET | Freq: Four times a day (QID) | ORAL | Status: DC | PRN
  Administered 2020-06-19 – 2020-06-20 (×3): 500 mg via ORAL
  Filled 2020-06-19 (×3): qty 1

## 2020-06-19 MED ORDER — ONDANSETRON HCL 4 MG PO TABS: 4.0000 mg | ORAL_TABLET | Freq: Four times a day (QID) | ORAL | Status: DC | PRN

## 2020-06-19 MED ORDER — 0.9 % SODIUM CHLORIDE (POUR BTL) OPTIME
TOPICAL | Status: DC | PRN
  Administered 2020-06-19: 10:00:00 1000 mL

## 2020-06-19 MED ORDER — GABAPENTIN 100 MG PO CAPS
100.0000 mg | ORAL_CAPSULE | Freq: Three times a day (TID) | ORAL | Status: DC
  Administered 2020-06-19 – 2020-06-20 (×3): 100 mg via ORAL
  Filled 2020-06-19 (×3): qty 1

## 2020-06-19 MED ORDER — MEPERIDINE HCL 25 MG/ML IJ SOLN: 6.2500 mg | INTRAMUSCULAR | Status: DC | PRN

## 2020-06-19 MED ORDER — METHOCARBAMOL 1000 MG/10ML IJ SOLN
500.0000 mg | Freq: Four times a day (QID) | INTRAVENOUS | Status: DC | PRN
  Filled 2020-06-19: qty 5

## 2020-06-19 MED ORDER — ONDANSETRON HCL 4 MG/2ML IJ SOLN
INTRAMUSCULAR | Status: DC | PRN
  Administered 2020-06-19: 4 mg via INTRAVENOUS

## 2020-06-19 MED ORDER — OXYCODONE HCL 5 MG PO TABS: 5.0000 mg | ORAL_TABLET | ORAL | 0 refills | Status: DC | PRN

## 2020-06-19 MED ORDER — CLINDAMYCIN PHOSPHATE 900 MG/50ML IV SOLN: 900.0000 mg | INTRAVENOUS | Status: DC

## 2020-06-19 MED ORDER — OXYCODONE HCL 5 MG PO TABS: 10.0000 mg | ORAL_TABLET | ORAL | Status: DC | PRN

## 2020-06-19 MED ORDER — POVIDONE-IODINE 10 % EX SWAB: 2.0000 "application " | Freq: Once | CUTANEOUS | Status: DC

## 2020-06-19 MED ORDER — HYDROMORPHONE HCL 1 MG/ML IJ SOLN
0.5000 mg | INTRAMUSCULAR | Status: DC | PRN
  Administered 2020-06-19: 1 mg via INTRAVENOUS
  Filled 2020-06-19: qty 1

## 2020-06-19 MED ORDER — PHENOL 1.4 % MT LIQD: 1.0000 | OROMUCOSAL | Status: DC | PRN

## 2020-06-19 MED ORDER — ACETAMINOPHEN 325 MG PO TABS
325.0000 mg | ORAL_TABLET | Freq: Four times a day (QID) | ORAL | Status: DC | PRN
  Administered 2020-06-19 – 2020-06-20 (×2): 650 mg via ORAL
  Filled 2020-06-19 (×2): qty 2

## 2020-06-19 MED ORDER — ADULT MULTIVITAMIN W/MINERALS CH: 1.0000 | ORAL_TABLET | Freq: Every day | ORAL | Status: DC

## 2020-06-19 MED ORDER — PROPOFOL 500 MG/50ML IV EMUL
INTRAVENOUS | Status: AC
  Filled 2020-06-19: qty 50

## 2020-06-19 MED ORDER — PROPOFOL 10 MG/ML IV BOLUS
INTRAVENOUS | Status: DC | PRN
  Administered 2020-06-19 (×3): 30 mg via INTRAVENOUS

## 2020-06-19 MED ORDER — OXYCODONE ER 9 MG PO C12A: 9.0000 mg | EXTENDED_RELEASE_CAPSULE | Freq: Two times a day (BID) | ORAL | Status: DC

## 2020-06-19 MED ORDER — LACTATED RINGERS IV SOLN: INTRAVENOUS | Status: DC

## 2020-06-19 MED ORDER — ACETAMINOPHEN 500 MG PO TABS: 1000.0000 mg | ORAL_TABLET | Freq: Once | ORAL | Status: DC

## 2020-06-19 MED ORDER — METOCLOPRAMIDE HCL 5 MG PO TABS: 5.0000 mg | ORAL_TABLET | Freq: Three times a day (TID) | ORAL | Status: DC | PRN

## 2020-06-19 MED ORDER — ASPIRIN 81 MG PO CHEW: 81.0000 mg | CHEWABLE_TABLET | Freq: Two times a day (BID) | ORAL | 0 refills | Status: DC

## 2020-06-19 MED ORDER — MIDAZOLAM HCL 2 MG/2ML IJ SOLN
INTRAMUSCULAR | Status: AC
  Filled 2020-06-19: qty 2

## 2020-06-19 MED ORDER — ONDANSETRON HCL 4 MG/2ML IJ SOLN: 4.0000 mg | Freq: Four times a day (QID) | INTRAMUSCULAR | Status: DC | PRN

## 2020-06-19 MED ORDER — PHENYLEPHRINE 40 MCG/ML (10ML) SYRINGE FOR IV PUSH (FOR BLOOD PRESSURE SUPPORT)
PREFILLED_SYRINGE | INTRAVENOUS | Status: DC | PRN
  Administered 2020-06-19: 80 ug via INTRAVENOUS
  Administered 2020-06-19 (×2): 120 ug via INTRAVENOUS

## 2020-06-19 MED ORDER — CLINDAMYCIN PHOSPHATE 900 MG/50ML IV SOLN
INTRAVENOUS | Status: DC | PRN
  Administered 2020-06-19: 900 mg via INTRAVENOUS

## 2020-06-19 MED ORDER — DEXMEDETOMIDINE (PRECEDEX) IN NS 20 MCG/5ML (4 MCG/ML) IV SYRINGE
PREFILLED_SYRINGE | INTRAVENOUS | Status: DC | PRN
  Administered 2020-06-19: 12 ug via INTRAVENOUS
  Administered 2020-06-19: 8 ug via INTRAVENOUS

## 2020-06-19 MED ORDER — ASPIRIN 81 MG PO CHEW
81.0000 mg | CHEWABLE_TABLET | Freq: Two times a day (BID) | ORAL | Status: DC
  Administered 2020-06-19 – 2020-06-20 (×2): 81 mg via ORAL
  Filled 2020-06-19 (×2): qty 1

## 2020-06-19 MED ORDER — ALUM & MAG HYDROXIDE-SIMETH 200-200-20 MG/5ML PO SUSP: 30.0000 mL | ORAL | Status: DC | PRN

## 2020-06-19 MED ORDER — CLINDAMYCIN PHOSPHATE 600 MG/50ML IV SOLN
600.0000 mg | Freq: Four times a day (QID) | INTRAVENOUS | Status: AC
Start: 2020-06-19 — End: 2020-06-19
  Administered 2020-06-19 (×2): 600 mg via INTRAVENOUS
  Filled 2020-06-19 (×2): qty 50

## 2020-06-19 MED ORDER — OXYCODONE HCL ER 10 MG PO T12A
10.0000 mg | EXTENDED_RELEASE_TABLET | Freq: Two times a day (BID) | ORAL | Status: DC
Start: 2020-06-19 — End: 2020-06-20
  Administered 2020-06-19 – 2020-06-20 (×2): 10 mg via ORAL
  Filled 2020-06-19 (×2): qty 1

## 2020-06-19 MED ORDER — MENTHOL 3 MG MT LOZG: 1.0000 | LOZENGE | OROMUCOSAL | Status: DC | PRN

## 2020-06-19 MED ORDER — BUPIVACAINE IN DEXTROSE 0.75-8.25 % IT SOLN
INTRATHECAL | Status: DC | PRN
  Administered 2020-06-19: 2 mL via INTRATHECAL

## 2020-06-19 MED ORDER — CHLORHEXIDINE GLUCONATE 0.12 % MT SOLN
15.0000 mL | Freq: Once | OROMUCOSAL | Status: AC
  Administered 2020-06-19: 08:00:00 15 mL via OROMUCOSAL
  Filled 2020-06-19: qty 15

## 2020-06-19 MED ORDER — HYDROCODONE-ACETAMINOPHEN 7.5-325 MG PO TABS: 1.0000 | ORAL_TABLET | Freq: Once | ORAL | Status: DC | PRN

## 2020-06-19 MED ORDER — SODIUM CHLORIDE 0.9 % IV SOLN: INTRAVENOUS | Status: DC

## 2020-06-19 MED ORDER — TRANEXAMIC ACID-NACL 1000-0.7 MG/100ML-% IV SOLN: 1000.0000 mg | INTRAVENOUS | Status: DC

## 2020-06-19 MED ORDER — TRANEXAMIC ACID-NACL 1000-0.7 MG/100ML-% IV SOLN
INTRAVENOUS | Status: AC
  Filled 2020-06-19: qty 100

## 2020-06-19 MED ORDER — DOCUSATE SODIUM 100 MG PO CAPS
100.0000 mg | ORAL_CAPSULE | Freq: Two times a day (BID) | ORAL | Status: DC
  Administered 2020-06-19 – 2020-06-20 (×3): 100 mg via ORAL
  Filled 2020-06-19 (×3): qty 1

## 2020-06-19 MED ORDER — OXYCODONE ER 9 MG PO C12A: 9.0000 mg | EXTENDED_RELEASE_CAPSULE | Freq: Two times a day (BID) | ORAL | 0 refills | Status: DC

## 2020-06-19 MED ORDER — PROPOFOL 500 MG/50ML IV EMUL
INTRAVENOUS | Status: DC | PRN
  Administered 2020-06-19: 50 ug/kg/min via INTRAVENOUS

## 2020-06-19 MED ORDER — PANTOPRAZOLE SODIUM 40 MG PO TBEC
40.0000 mg | DELAYED_RELEASE_TABLET | Freq: Every day | ORAL | Status: DC
  Administered 2020-06-19 – 2020-06-20 (×2): 40 mg via ORAL
  Filled 2020-06-19 (×2): qty 1

## 2020-06-19 MED ORDER — METHOCARBAMOL 500 MG PO TABS: 500.0000 mg | ORAL_TABLET | Freq: Four times a day (QID) | ORAL | 1 refills | Status: DC | PRN

## 2020-06-19 MED ORDER — MIDAZOLAM HCL 5 MG/5ML IJ SOLN
INTRAMUSCULAR | Status: DC | PRN
  Administered 2020-06-19: 2 mg via INTRAVENOUS

## 2020-06-19 MED ORDER — SODIUM CHLORIDE 0.9 % IR SOLN
Status: DC | PRN
  Administered 2020-06-19: 1000 mL

## 2020-06-19 MED ORDER — METOCLOPRAMIDE HCL 5 MG/ML IJ SOLN: 5.0000 mg | Freq: Three times a day (TID) | INTRAMUSCULAR | Status: DC | PRN

## 2020-06-19 MED ORDER — TRANEXAMIC ACID-NACL 1000-0.7 MG/100ML-% IV SOLN
INTRAVENOUS | Status: DC | PRN
  Administered 2020-06-19: 1000 mg via INTRAVENOUS

## 2020-06-19 MED ORDER — CLINDAMYCIN PHOSPHATE 900 MG/50ML IV SOLN
INTRAVENOUS | Status: AC
  Filled 2020-06-19: qty 50

## 2020-06-19 MED ORDER — HYDROMORPHONE HCL 1 MG/ML IJ SOLN
0.2500 mg | INTRAMUSCULAR | Status: DC | PRN
Start: 2020-06-19 — End: 2020-06-19

## 2020-06-19 SURGICAL SUPPLY — 56 items
BENZOIN TINCTURE PRP APPL 2/3 (GAUZE/BANDAGES/DRESSINGS) ×3 IMPLANT
BLADE CLIPPER SURG (BLADE) IMPLANT
BLADE SAW SGTL 18X1.27X75 (BLADE) ×2 IMPLANT
BLADE SAW SGTL 18X1.27X75MM (BLADE) ×1
CLOSURE WOUND 1/2 X4 (GAUZE/BANDAGES/DRESSINGS) ×2
COLLAR OFFSET CORAIL SZ 15 HIP (Stem) ×1 IMPLANT
CORAIL OFFSET COLLAR SZ 15 HIP (Stem) ×3 IMPLANT
COVER SURGICAL LIGHT HANDLE (MISCELLANEOUS) ×3 IMPLANT
COVER WAND RF STERILE (DRAPES) IMPLANT
CUP SECTOR GRIPTON 58MM (Orthopedic Implant) ×3 IMPLANT
DRAPE C-ARM 42X72 X-RAY (DRAPES) ×3 IMPLANT
DRAPE STERI IOBAN 125X83 (DRAPES) ×3 IMPLANT
DRAPE U-SHAPE 47X51 STRL (DRAPES) ×9 IMPLANT
DRSG AQUACEL AG ADV 3.5X10 (GAUZE/BANDAGES/DRESSINGS) ×3 IMPLANT
DRSG XEROFORM 1X8 (GAUZE/BANDAGES/DRESSINGS) ×3 IMPLANT
DURAPREP 26ML APPLICATOR (WOUND CARE) ×3 IMPLANT
ELECT BLADE 4.0 EZ CLEAN MEGAD (MISCELLANEOUS) ×3
ELECT BLADE 6.5 EXT (BLADE) IMPLANT
ELECT REM PT RETURN 9FT ADLT (ELECTROSURGICAL) ×3
ELECTRODE BLDE 4.0 EZ CLN MEGD (MISCELLANEOUS) ×1 IMPLANT
ELECTRODE REM PT RTRN 9FT ADLT (ELECTROSURGICAL) ×1 IMPLANT
FACESHIELD WRAPAROUND (MASK) ×6 IMPLANT
GLOVE BIOGEL PI IND STRL 8 (GLOVE) ×2 IMPLANT
GLOVE BIOGEL PI INDICATOR 8 (GLOVE) ×4
GLOVE ECLIPSE 8.0 STRL XLNG CF (GLOVE) ×3 IMPLANT
GLOVE ORTHO TXT STRL SZ7.5 (GLOVE) ×6 IMPLANT
GOWN STRL REUS W/ TWL LRG LVL3 (GOWN DISPOSABLE) ×2 IMPLANT
GOWN STRL REUS W/ TWL XL LVL3 (GOWN DISPOSABLE) ×2 IMPLANT
GOWN STRL REUS W/TWL LRG LVL3 (GOWN DISPOSABLE) ×4
GOWN STRL REUS W/TWL XL LVL3 (GOWN DISPOSABLE) ×4
HANDPIECE INTERPULSE COAX TIP (DISPOSABLE) ×2
HEAD CERAMIC 36 PLUS 8.5 12 14 (Hips) ×3 IMPLANT
KIT BASIN OR (CUSTOM PROCEDURE TRAY) ×3 IMPLANT
KIT TURNOVER KIT B (KITS) ×3 IMPLANT
LINER NEUTRAL 36X58 PLUS4 ×3 IMPLANT
MANIFOLD NEPTUNE II (INSTRUMENTS) ×3 IMPLANT
NS IRRIG 1000ML POUR BTL (IV SOLUTION) ×3 IMPLANT
PACK TOTAL JOINT (CUSTOM PROCEDURE TRAY) ×3 IMPLANT
PAD ARMBOARD 7.5X6 YLW CONV (MISCELLANEOUS) ×3 IMPLANT
SET HNDPC FAN SPRY TIP SCT (DISPOSABLE) ×1 IMPLANT
STAPLER VISISTAT 35W (STAPLE) ×3 IMPLANT
STRIP CLOSURE SKIN 1/2X4 (GAUZE/BANDAGES/DRESSINGS) ×4 IMPLANT
SUT ETHIBOND NAB CT1 #1 30IN (SUTURE) ×3 IMPLANT
SUT MNCRL AB 4-0 PS2 18 (SUTURE) IMPLANT
SUT VIC AB 0 CT1 27 (SUTURE) ×2
SUT VIC AB 0 CT1 27XBRD ANBCTR (SUTURE) ×1 IMPLANT
SUT VIC AB 1 CT1 27 (SUTURE) ×4
SUT VIC AB 1 CT1 27XBRD ANBCTR (SUTURE) ×2 IMPLANT
SUT VIC AB 2-0 CT1 27 (SUTURE) ×4
SUT VIC AB 2-0 CT1 TAPERPNT 27 (SUTURE) ×2 IMPLANT
TOWEL GREEN STERILE (TOWEL DISPOSABLE) ×3 IMPLANT
TOWEL GREEN STERILE FF (TOWEL DISPOSABLE) ×3 IMPLANT
TRAY CATH 16FR W/PLASTIC CATH (SET/KITS/TRAYS/PACK) IMPLANT
TRAY FOLEY W/BAG SLVR 16FR (SET/KITS/TRAYS/PACK) ×2
TRAY FOLEY W/BAG SLVR 16FR ST (SET/KITS/TRAYS/PACK) ×1 IMPLANT
WATER STERILE IRR 1000ML POUR (IV SOLUTION) ×6 IMPLANT

## 2020-06-19 NOTE — Interval H&P Note (Signed)
History and Physical Interval Note: Patient understands that he is here for a left total hip arthroplasty to treat the pain from his significant left hip osteoarthritis.  There has been no acute change in his medical status.  See recent H&P.  The risks and benefits of surgery have been explained in detail and informed consent is obtained.  The left hip has been marked.  06/19/2020 9:10 AM  Brian Merritt  has presented today for surgery, with the diagnosis of osteoarthritis left hip.  The various methods of treatment have been discussed with the patient and family. After consideration of risks, benefits and other options for treatment, the patient has consented to  Procedure(s): LEFT TOTAL HIP ARTHROPLASTY ANTERIOR APPROACH (Left) as a surgical intervention.  The patient's history has been reviewed, patient examined, no change in status, stable for surgery.  I have reviewed the patient's chart and labs.  Questions were answered to the patient's satisfaction.     Kathryne Hitch

## 2020-06-19 NOTE — Anesthesia Procedure Notes (Signed)
Spinal  Patient location during procedure: OR Start time: 06/19/2020 9:23 AM End time: 06/19/2020 9:28 AM Staffing Performed: anesthesiologist  Anesthesiologist: Gaynelle Adu, MD Preanesthetic Checklist Completed: patient identified, IV checked, risks and benefits discussed, surgical consent, monitors and equipment checked, pre-op evaluation and timeout performed Spinal Block Patient position: sitting Prep: DuraPrep Patient monitoring: cardiac monitor, continuous pulse ox and blood pressure Approach: midline Location: L4-5 Injection technique: single-shot Needle Needle type: Pencan  Needle gauge: 24 G Needle length: 9 cm Assessment Sensory level: T8 Additional Notes Functioning IV was confirmed and monitors were applied. Sterile prep and drape, including hand hygiene and sterile gloves were used. The patient was positioned and the spine was prepped. The skin was anesthetized with lidocaine.  Free flow of clear CSF was obtained prior to injecting local anesthetic into the CSF.  The spinal needle aspirated freely following injection.  The needle was carefully withdrawn.  The patient tolerated the procedure well.

## 2020-06-19 NOTE — Progress Notes (Signed)
Orthopedic Tech Progress Note Patient Details:  Brian Merritt 07/29/1965 [id number] Applied an OVER HEAD FRAME WIT TRAPEZE  Patient ID: Brian Merritt, male   DOB: 1966-02-24, 54 y.o.   MRN: [id number]   Donald Pore 06/19/2020, 3:02 PM

## 2020-06-19 NOTE — Brief Op Note (Signed)
06/19/2020  10:47 AM  PATIENT:  Brian Merritt  54 y.o. male  PRE-OPERATIVE DIAGNOSIS:  osteoarthritis left hip  POST-OPERATIVE DIAGNOSIS:  osteoarthritis left hip  PROCEDURE:  Procedure(s): LEFT TOTAL HIP ARTHROPLASTY ANTERIOR APPROACH (Left)  SURGEON:  Surgeon(s) and Role:    Kathryne Hitch, MD - Primary  PHYSICIAN ASSISTANT:  Rexene Edison, PA-C  ANESTHESIA:   spinal  EBL:  250 mL   Merritt:  YES  DICTATION: .Other Dictation: Dictation Number [id number]  PLAN OF CARE: Admit for overnight observation  PATIENT DISPOSITION:  PACU - hemodynamically stable.   Delay start of Pharmacological VTE agent (>24hrs) due to surgical blood loss or risk of bleeding: no

## 2020-06-19 NOTE — Evaluation (Signed)
Physical Therapy Evaluation Patient Details Name: Brian Merritt MRN: [id number] DOB: 30-Mar-1966 Today's Date: 06/19/2020   History of Present Illness  Pt is a 54 y/o male s/p L THA. PMH includes lumbar surgery.  Clinical Impression  Pt is s/p surgery above with deficits below. Pt requiring min to mod A for bed mobility tasks. Once sitting, pt reporting increased lightheadedness and felt as if he were going to pass out, so return to sidelying. Pt reporting improvement of symptoms upon return to sidelying. Anticipate pt will progress well once symptoms improve. Will continue to follow acutely.     Follow Up Recommendations Follow surgeon's recommendation for DC plan and follow-up therapies    Equipment Recommendations  Rolling walker with 5" wheels;3in1 (PT)    Recommendations for Other Services       Precautions / Restrictions Precautions Precautions: Fall Restrictions Weight Bearing Restrictions: Yes LLE Weight Bearing: Weight bearing as tolerated      Mobility  Bed Mobility Overal bed mobility: Needs Assistance Bed Mobility: Supine to Sit;Sit to Sidelying     Supine to sit: Min assist   Sit to sidelying: Mod assist General bed mobility comments: Pt requiring min A to sit at EOB. ONce sitting, pt reporting increased dizziness and light headedness and reports he felt like he was going to pass out. Assisted pt back to sidelying. Pt reports he felt better upon return to sidelying. Further mobility deferred.    Transfers                    Ambulation/Gait                Stairs            Wheelchair Mobility    Modified Rankin (Stroke Patients Only)       Balance Overall balance assessment: Needs assistance Sitting-balance support: No upper extremity supported Sitting balance-Leahy Scale: Fair                                       Pertinent Vitals/Pain Pain Assessment: Faces Faces Pain Scale: Hurts even more Pain  Location: L hip Pain Descriptors / Indicators: Aching;Operative site guarding Pain Intervention(s): Limited activity within patient's tolerance;Monitored during session;Repositioned    Home Living Family/patient expects to be discharged to:: Private residence Living Arrangements: Spouse/significant other;Children Available Help at Discharge: Family Type of Home: House Home Access: Stairs to enter Entrance Stairs-Rails: None Secretary/administrator of Steps: 2 Home Layout: One level Home Equipment: None      Prior Function Level of Independence: Independent               Hand Dominance        Extremity/Trunk Assessment   Upper Extremity Assessment Upper Extremity Assessment: Overall WFL for tasks assessed    Lower Extremity Assessment Lower Extremity Assessment: LLE deficits/detail LLE Deficits / Details: Deficits consistent with post op pain and weakness.    Cervical / Trunk Assessment Cervical / Trunk Assessment: Normal  Communication   Communication: No difficulties  Cognition Arousal/Alertness: Awake/alert Behavior During Therapy: WFL for tasks assessed/performed Overall Cognitive Status: Within Functional Limits for tasks assessed                                        General Comments General comments (skin integrity,  edema, etc.): Educated about HEP including ankle pumps and heel slides to perform.    Exercises     Assessment/Plan    PT Assessment Patient needs continued PT services  PT Problem List Decreased strength;Decreased range of motion;Decreased activity tolerance;Decreased balance;Decreased mobility;Decreased knowledge of use of DME;Decreased knowledge of precautions;Pain       PT Treatment Interventions DME instruction;Gait training;Stair training;Functional mobility training;Therapeutic activities;Therapeutic exercise;Balance training;Patient/family education    PT Goals (Current goals can be found in the Care Plan  section)  Acute Rehab PT Goals Patient Stated Goal: to go home PT Goal Formulation: With patient Time For Goal Achievement: 07/03/20 Potential to Achieve Goals: Good    Frequency 7X/week   Barriers to discharge        Co-evaluation               AM-PAC PT "6 Clicks" Mobility  Outcome Measure Help needed turning from your back to your side while in a flat bed without using bedrails?: A Little Help needed moving from lying on your back to sitting on the side of a flat bed without using bedrails?: A Little Help needed moving to and from a bed to a chair (including a wheelchair)?: A Little Help needed standing up from a chair using your arms (e.g., wheelchair or bedside chair)?: A Little Help needed to walk in hospital room?: A Little Help needed climbing 3-5 steps with a railing? : A Lot 6 Click Score: 17    End of Session   Activity Tolerance: Treatment limited secondary to medical complications (Comment) (dizziness and lightheadedness) Patient left: in bed;with call bell/phone within reach;with family/visitor present Nurse Communication: Mobility status PT Visit Diagnosis: Other abnormalities of gait and mobility (R26.89);Pain Pain - Right/Left: Left Pain - part of body: Hip    Time: 8416-6063 PT Time Calculation (min) (ACUTE ONLY): 12 min   Charges:   PT Evaluation $PT Eval Low Complexity: 1 Low          Cindee Salt, DPT  Acute Rehabilitation Services  Pager: [phone] Office: [phone]   Brian Merritt 06/19/2020, 3:33 PM

## 2020-06-19 NOTE — Transfer of Care (Signed)
Immediate Anesthesia Transfer of Care Note  Patient: Brian Merritt  Procedure(s) Performed: LEFT TOTAL HIP ARTHROPLASTY ANTERIOR APPROACH (Left Hip)  Patient Location: PACU  Anesthesia Type:Spinal  Level of Consciousness: drowsy  Airway & Oxygen Therapy: Patient Spontanous Breathing and Patient connected to face mask oxygen  Post-op Assessment: Report given to RN and Post -op Vital signs reviewed and stable  Post vital signs: Reviewed and stable  Last Vitals:  Vitals Value Taken Time  BP 100/62 06/19/20 1102  Temp    Pulse 59 06/19/20 1103  Resp 12 06/19/20 1103  SpO2 97 % 06/19/20 1103  Vitals shown include unvalidated device data.  Last Pain:  Vitals:   06/19/20 0816  TempSrc:   PainSc: 5          Complications: No complications documented.

## 2020-06-19 NOTE — Discharge Instructions (Signed)

## 2020-06-20 DIAGNOSIS — M1612 Unilateral primary osteoarthritis, left hip: Secondary | ICD-10-CM | POA: Diagnosis not present

## 2020-06-20 LAB — CBC
HCT: 32.8 % — ABNORMAL LOW (ref 39.0–52.0)
Hemoglobin: 11.5 g/dL — ABNORMAL LOW (ref 13.0–17.0)
MCH: 34 pg (ref 26.0–34.0)
MCHC: 35.1 g/dL (ref 30.0–36.0)
MCV: 97 fL (ref 80.0–100.0)
Platelets: 207 10*3/uL (ref 150–400)
RBC: 3.38 MIL/uL — ABNORMAL LOW (ref 4.22–5.81)
RDW: 14.5 % (ref 11.5–15.5)
WBC: 6.3 10*3/uL (ref 4.0–10.5)
nRBC: 0 % (ref 0.0–0.2)

## 2020-06-20 LAB — BASIC METABOLIC PANEL
Anion gap: 10 (ref 5–15)
BUN: 15 mg/dL (ref 6–20)
CO2: 25 mmol/L (ref 22–32)
Calcium: 8.8 mg/dL — ABNORMAL LOW (ref 8.9–10.3)
Chloride: 99 mmol/L (ref 98–111)
Creatinine, Ser: 1.01 mg/dL (ref 0.61–1.24)
GFR, Estimated: >60 mL/min (ref >60)
Glucose, Bld: 159 mg/dL — ABNORMAL HIGH (ref 70–99)
Potassium: 3.7 mmol/L (ref 3.5–5.1)
Sodium: 134 mmol/L — ABNORMAL LOW (ref 135–145)

## 2020-06-20 NOTE — Discharge Summary (Signed)
Patient ID: Brian Merritt MRN: [id number] DOB/AGE: [birth year] 54 y.o.  Admit date: 06/19/2020 Discharge date: 06/20/2020  Admission Diagnoses:  Principal Problem:   Unilateral primary osteoarthritis, left hip Active Problems:   Status post total replacement of left hip   Discharge Diagnoses:  Same  Past Medical History:  Diagnosis Date  . Arthritis    knee  . Heart murmur    as a child  . Ureteral stricture    as a child    Surgeries: Procedure(s): LEFT TOTAL HIP ARTHROPLASTY ANTERIOR APPROACH on 06/19/2020   Consultants:   Discharged Condition: Improved  Hospital Course: Brian Merritt is an 54 y.o. male who was admitted 06/19/2020 for operative treatment ofUnilateral primary osteoarthritis, left hip. Patient has severe unremitting pain that affects sleep, daily activities, and work/hobbies. After pre-op clearance the patient was taken to the operating room on 06/19/2020 and underwent  Procedure(s): LEFT TOTAL HIP ARTHROPLASTY ANTERIOR APPROACH.    Patient was given perioperative antibiotics:  Anti-infectives (From admission, onward)   Start     Dose/Rate Route Frequency Ordered Stop   06/20/20 0600  clindamycin (CLEOCIN) IVPB 900 mg  Status:  Discontinued        900 mg 100 mL/hr over 30 Minutes Intravenous On call to O.R. 06/19/20 1233 06/19/20 1239   06/19/20 1530  clindamycin (CLEOCIN) IVPB 600 mg        600 mg 100 mL/hr over 30 Minutes Intravenous Every 6 hours 06/19/20 1233 06/19/20 2234   06/19/20 0802  clindamycin (CLEOCIN) 900 MG/50ML IVPB       Note to Pharmacy: Pauletta Browns   : cabinet override      06/19/20 0802 06/19/20 0943       Patient was given sequential compression devices, early ambulation, and chemoprophylaxis to prevent DVT.  Patient benefited maximally from hospital stay and there were no complications.    Recent vital signs:  Patient Vitals for the past 24 hrs:  BP Temp Temp src Pulse Resp SpO2 Height Weight  06/20/20 0431  112/72 -- -- 87 -- -- -- --  06/20/20 0413 (!) 74/45 97.8 F (36.6 C) Oral 86 18 100 % -- --  06/19/20 2319 122/85 98.6 F (37 C) Oral 88 18 99 % -- --  06/19/20 1944 116/76 98.8 F (37.1 C) Oral 90 20 99 % -- --  06/19/20 1530 98/62 98 F (36.7 C) Oral 77 17 98 % -- --  06/19/20 1239 109/70 -- -- 64 18 100 % -- --  06/19/20 1220 110/75 -- -- (!) 54 13 100 % -- --  06/19/20 1205 118/70 -- -- 100 14 96 % -- --  06/19/20 1150 108/64 -- -- (!) 51 13 99 % -- --  06/19/20 1135 104/71 97.9 F (36.6 C) -- (!) 51 13 100 % -- --  06/19/20 1120 101/70 -- -- 71 16 98 % -- --  06/19/20 1105 100/62 97.9 F (36.6 C) -- (!) 58 (!) 29 99 % -- --  06/19/20 0810 (!) 148/86 98.4 F (36.9 C) Oral 70 18 99 % 6\' 3"  (1.905 m) 95.3 kg     Recent laboratory studies:  Recent Labs    06/20/20 0447  WBC 6.3  HGB 11.5*  HCT 32.8*  PLT 207  NA 134*  K 3.7  CL 99  CO2 25  BUN 15  CREATININE 1.01  GLUCOSE 159*  CALCIUM 8.8*     Discharge Medications:   Allergies as of 06/20/2020  Reactions   Advil [ibuprofen] Hives   Hives, swelling Can take Motrin, but usually takes tylenol or aleve   Penicillins Hives      Medication List    TAKE these medications   aspirin 81 MG chewable tablet Chew 1 tablet (81 mg total) by mouth 2 (two) times daily.   methocarbamol 500 MG tablet Commonly known as: ROBAXIN Take 1 tablet (500 mg total) by mouth every 6 (six) hours as needed for muscle spasms.   One Daily Mens Tabs Take 1 tablet by mouth daily.   oxyCODONE 5 MG immediate release tablet Commonly known as: Oxy IR/ROXICODONE Take 1 tablet (5 mg total) by mouth every 4 (four) hours as needed (pain). What changed: when to take this   oxyCODONE ER 9 MG C12a Take 9 mg by mouth in the morning and at bedtime.            Durable Medical Equipment  (From admission, onward)         Start     Ordered   06/19/20 1234  DME 3 n 1  Once        06/19/20 1233   06/19/20 1234  DME Walker rolling   Once       Question Answer Comment  Walker: With 5 Inch Wheels   Patient needs a walker to treat with the following condition Status post total replacement of left hip      06/19/20 1233          Diagnostic Studies: DG Pelvis Portable  Result Date: 06/19/2020 CLINICAL DATA:  Left hip arthroplasty EXAM: PORTABLE PELVIS 1-2 VIEWS COMPARISON:  04/10/2020 FINDINGS: Single AP portable view of the pelvis demonstrates postsurgical changes following left total hip arthroplasty. Arthroplasty components appear in their expected alignment. No periprosthetic fracture is identified. Expected postoperative changes within the overlying soft tissues. Degenerative changes are present at the contralateral right hip. IMPRESSION: Satisfactory postoperative appearance status post left total hip arthroplasty. Electronically Signed   By: Duanne Guess D.O.   On: 06/19/2020 11:56   DG C-Arm 1-60 Min  Result Date: 06/19/2020 CLINICAL DATA:  Status post total left hip arthroplasty. EXAM: DG C-ARM 1-60 MIN; OPERATIVE LEFT HIP WITH PELVIS CONTRAST:  None FLUOROSCOPY TIME:  Fluoroscopy Time:  29 seconds Radiation Exposure Index (if provided by the fluoroscopic device): 3.4 mGy Number of Acquired Spot Images: 0 COMPARISON:  04/10/2020 FINDINGS: Five sequential images obtained via C-arm radiography in the operating room were submitted. Images demonstrate left hip arthroplasty. The hardware components are in anatomic alignment and no periprosthetic fracture or subluxation identified. IMPRESSION: 1. No complications identified status post left hip arthroplasty. Electronically Signed   By: Signa Kell M.D.   On: 06/19/2020 11:49   DG HIP OPERATIVE UNILAT W OR W/O PELVIS LEFT  Result Date: 06/19/2020 CLINICAL DATA:  Status post total left hip arthroplasty. EXAM: DG C-ARM 1-60 MIN; OPERATIVE LEFT HIP WITH PELVIS CONTRAST:  None FLUOROSCOPY TIME:  Fluoroscopy Time:  29 seconds Radiation Exposure Index (if provided by the  fluoroscopic device): 3.4 mGy Number of Acquired Spot Images: 0 COMPARISON:  04/10/2020 FINDINGS: Five sequential images obtained via C-arm radiography in the operating room were submitted. Images demonstrate left hip arthroplasty. The hardware components are in anatomic alignment and no periprosthetic fracture or subluxation identified. IMPRESSION: 1. No complications identified status post left hip arthroplasty. Electronically Signed   By: Signa Kell M.D.   On: 06/19/2020 11:49    Disposition: Discharge disposition: 01-Home or Self  Care          Follow-up Information    Kathryne Hitch, MD Follow up in 2 week(s).   Specialty: Orthopedic Surgery Contact information: [address] [place] [address] [phone]                Signed: Kathryne Hitch 06/20/2020, 7:42 AM

## 2020-06-20 NOTE — Progress Notes (Signed)
Physical Therapy Treatment Patient Details Name: Brian Merritt MRN: [id number] DOB: 06-Feb-1966 Today's Date: 06/20/2020    History of Present Illness Pt is a 54 y/o male s/p L THA. PMH includes lumbar surgery.    PT Comments    Pt tolerates treatment well, progressing to transfers and gait/stair training. Pt is able to perform all mobility required in the home setting without physical assistance at this time. Pt ambulates well with use of crutches with PT encouraging gradual increase in  WB through LLE with activity. Pt will benefit from continued acute PT services and HHPT at the time of discharge to continue improvement upon his current gait and balance deficits.  Follow Up Recommendations  Follow surgeon's recommendation for DC plan and follow-up therapies (HHPT would be beneficial for stair training)     Equipment Recommendations  3in1 (PT) (pt owns crutches)    Recommendations for Other Services       Precautions / Restrictions Precautions Precautions: Fall Restrictions Weight Bearing Restrictions: Yes LLE Weight Bearing: Weight bearing as tolerated    Mobility  Bed Mobility Overal bed mobility: Needs Assistance Bed Mobility: Supine to Sit     Supine to sit: Supervision        Transfers Overall transfer level: Needs assistance Equipment used: Rolling walker (2 wheeled);None Transfers: Sit to/from Stand Sit to Stand: Supervision            Ambulation/Gait Ambulation/Gait assistance: Supervision Gait Distance (Feet): 150 Feet (additional 30' with crutches) Assistive device: Rolling walker (2 wheeled);Crutches Gait Pattern/deviations: Step-to pattern Gait velocity: reduced Gait velocity interpretation: <1.8 ft/sec, indicate of risk for recurrent falls General Gait Details: pt with short step-to gait, PT encourages increased WB through LLE durign stance phase   Stairs Stairs: Yes Stairs assistance: Min assist Stair Management: One rail Left;Step  to pattern Number of Stairs: 3 General stair comments: PT provides cues for technique, "up with the good leg and down with the bad leg"   Wheelchair Mobility    Modified Rankin (Stroke Patients Only)       Balance Overall balance assessment: Needs assistance Sitting-balance support: No upper extremity supported;Feet supported Sitting balance-Leahy Scale: Good     Standing balance support: No upper extremity supported Standing balance-Leahy Scale: Fair                              Cognition Arousal/Alertness: Awake/alert Behavior During Therapy: WFL for tasks assessed/performed Overall Cognitive Status: Within Functional Limits for tasks assessed                                        Exercises      General Comments General comments (skin integrity, edema, etc.): VSS on RA      Pertinent Vitals/Pain Pain Assessment: 0-10 Pain Score: 6  Pain Location: L hip and shin Pain Descriptors / Indicators: Aching Pain Intervention(s): Monitored during session    Home Living                      Prior Function            PT Goals (current goals can now be found in the care plan section) Acute Rehab PT Goals Patient Stated Goal: to go home Progress towards PT goals: Progressing toward goals    Frequency    7X/week  PT Plan Current plan remains appropriate    Co-evaluation              AM-PAC PT "6 Clicks" Mobility   Outcome Measure  Help needed turning from your back to your side while in a flat bed without using bedrails?: A Little Help needed moving from lying on your back to sitting on the side of a flat bed without using bedrails?: A Little Help needed moving to and from a bed to a chair (including a wheelchair)?: A Little Help needed standing up from a chair using your arms (e.g., wheelchair or bedside chair)?: A Little Help needed to walk in hospital room?: A Little Help needed climbing 3-5 steps with a  railing? : A Little 6 Click Score: 18    End of Session   Activity Tolerance: Patient tolerated treatment well Patient left: in bed;with call bell/phone within reach Nurse Communication: Mobility status PT Visit Diagnosis: Other abnormalities of gait and mobility (R26.89);Pain Pain - Right/Left: Left Pain - part of body: Hip     Time: 0822-0839 PT Time Calculation (min) (ACUTE ONLY): 17 min  Charges:  $Gait Training: 8-22 mins                     Arlyss Gandy, PT, DPT Acute Rehabilitation Pager: [phone]    Arlyss Gandy 06/20/2020, 9:08 AM

## 2020-06-20 NOTE — Anesthesia Postprocedure Evaluation (Signed)
Anesthesia Post Note  Patient: Brian Merritt  Procedure(s) Performed: LEFT TOTAL HIP ARTHROPLASTY ANTERIOR APPROACH (Left Hip)     Patient location during evaluation: PACU Anesthesia Type: Spinal Level of consciousness: awake and alert Pain management: pain level controlled Vital Signs Assessment: post-procedure vital signs reviewed and stable Respiratory status: spontaneous breathing and respiratory function stable Cardiovascular status: blood pressure returned to baseline and stable Postop Assessment: spinal receding Anesthetic complications: no   No complications documented.  Last Vitals:  Vitals:   06/20/20 0431 06/20/20 0805  BP: 112/72 (!) 98/59  Pulse: 87 84  Resp:  17  Temp:  36.9 C  SpO2:  98%    Last Pain:  Vitals:   06/20/20 0805  TempSrc: Oral  PainSc:    Pain Goal: Patients Stated Pain Goal: 3 (06/20/20 0406)                 Kennieth Rad

## 2020-06-20 NOTE — Op Note (Signed)
NAME: Brian Merritt, OLMEDA MEDICAL RECORD [id number] ACCOUNT [id number] DATE OF BIRTH:05-26-1966 FACILITY: [facility] LOCATION: [facility] PHYSICIAN:Nastassia Bazaldua Aretha Parrot, MD  OPERATIVE REPORT  DATE OF PROCEDURE:  06/19/2020  PREOPERATIVE DIAGNOSES:  Severe end-stage arthritis and degenerative joint disease, left hip.  POSTOPERATIVE DIAGNOSES:  Severe end-stage arthritis and degenerative joint disease, left hip.  PROCEDURE:  Left total hip arthroplasty through direct anterior approach.  IMPLANTS:  DePuy Sector Gription acetabular component size 58, size 36+4 neutral polyethylene liner, size 15 Corail femoral component with high offset, size 36+8.5 ceramic hip ball.  SURGEON:  Vanita Panda. Magnus Ivan, MD  ASSISTANT:  Richardean Canal, PA-C  ANESTHESIA:  Spinal.  ANTIBIOTICS:  900 mg IV clindamycin.  BLOOD LOSS:  250 mL.  COMPLICATIONS:  None.  INDICATIONS:  The patient is a very pleasant 54 year old gentleman who has been having worsening left hip pain for some time now.  His x-rays confirm severe end-stage arthritis of his left hip.  His clinical exam follows this as well.  He has severe pain  with rotation of the hip.  At this point, his left hip pain is detrimentally affecting his mobility, his quality of life and his activities of daily living.  He has tried and failed all forms of conservative treatment.  At this point, we have  recommended a total hip arthroplasty through direct anterior approach.  We had a long and thorough discussion about the risk of acute blood loss anemia, nerve and vessel injury, fracture, infection, dislocation, DVT and implant failure.  We also talked  about skin and soft tissue issues.  He understands our goals are to decrease pain, improve mobility and overall improve quality of life.  DESCRIPTION OF PROCEDURE:  After informed consent was obtained and appropriate left hip was marked, he was brought to the operating room and sat up on the stretcher where  spinal anesthesia was obtained.  He was laid in the supine position on the  stretcher.  Foley catheter was placed.  I assessed his leg length and he was slightly short on the left than the right.  Traction boots were placed on both his feet.  Next, he was placed supine on the Hana fracture table, the perineal post in place and  both legs in line skeletal traction device and no traction applied.  His left operative hip was prepped and draped with DuraPrep and sterile drapes.  A timeout was called.  He was identified as correct patient, correct left hip.  I then made an incision  just inferior and posterior to the anterior superior iliac spine and carried this obliquely down the leg.  We dissected down tensor fascia lata muscle.  Tensor fascia was then divided longitudinally to proceed with direct anterior approach to the hip.   We identified and cauterized circumflex vessels and identified the hip capsule, opened up the hip capsule in an L-type format, finding moderate joint effusion.  I placed Cobra retractors within the medial and lateral joint capsule and made our femoral  neck cut with oscillating saw just proximal to the lesser trochanter and completed this with an osteotome.  We placed a corkscrew guide in the femoral head and removed the femoral head in its entirety and found a wide area devoid of cartilage and it was  a very large femoral head.  We then placed a bent Hohmann over the medial acetabular rim and removed remnants of the acetabulum and other debris including significant periarticular osteophytes and then began reaming under direct visualization from a size  43 reamer in stepwise increments, going up to a size 57, with all reamers under direct visualization, the last reamer was placed under direct fluoroscopy, so we could obtain our depth of reaming, our inclination and anteversion.  I then placed the real  DePuy Sector Gription acetabular component size 58 with a 36+4 neutral polyethylene  liner after having medialized and then based on his offset.  Attention was then turned to the femur.  With the leg externally rotated to 120 degrees, extended and  adducted, we were able to place a Mueller retractor medially and Hohmann retractor behind the greater trochanter.  We released lateral joint capsule and used a box-cutting osteotome to enter the femoral canal and a rongeur to lateralize, then began  broaching using the Corail broaching system from a size 8 going all the way to a size 15.  With a size 15 in place, we trialed a standard offset femoral neck and a 36+1.5 hip ball, reduced this in the acetabulum.  We definitely needed more leg length.   We dislocated the hip and removed the trial components.  We then placed the real Corail femoral component size 15 with a high offset, with a 36+8.5 ceramic hip ball and again reduced this in the acetabulum.  After that, we were pleased with his leg  length, offset, range of motion and stability, assessed radiographically and mechanically.  We then irrigated the soft tissue with normal saline solution using pulsatile lavage.  We were able to reapproximate the joint capsule with interrupted #1  Ethibond suture, followed by #1 Vicryl to close the tensor fascia.  0 Vicryl was used to close the deep tissue and 2-0 Vicryl was used to close subcutaneous tissue.  The skin was reapproximated with staples.  Xeroform and Aquacel dressing was applied.   He was taken off the Hana table and taken to recovery room in stable condition, with all final counts being correct.  There were no complications noted.  Of note, Rexene Edison, PA-C did assist during the entire case and assistance was crucial for  facilitating all aspects of this case.  HN/NUANCE  D:06/19/2020 T:06/20/2020 JOB:[id number]

## 2020-06-20 NOTE — Progress Notes (Signed)
Patient alert and oriented, mae's well, voiding adequate amount of urine, swallowing without difficulty, no c/o pain at time of discharge. Patient discharged home with family. Script and discharged instructions given to patient. Patient and family stated understanding of instructions given. Patient refused the DME offered by therapy Patient has an appointment with Dr. Magnus Ivan.

## 2020-06-20 NOTE — Progress Notes (Signed)
Subjective: 1 Day Post-Op Procedure(s) (LRB): LEFT TOTAL HIP ARTHROPLASTY ANTERIOR APPROACH (Left) Patient reports pain as moderate.    Objective: Vital signs in last 24 hours: Temp:  [97.8 F (36.6 C)-98.8 F (37.1 C)] 97.8 F (36.6 C) (12/22 0413) Pulse Rate:  [51-100] 87 (12/22 0431) Resp:  [13-29] 18 (12/22 0413) BP: (74-148)/(45-86) 112/72 (12/22 0431) SpO2:  [96 %-100 %] 100 % (12/22 0413) Weight:  [95.3 kg] 95.3 kg (12/21 0810)  Intake/Output from previous day: 12/21 0701 - 12/22 0700 In: 950 [I.V.:800; IV Piggyback:150] Out: 1075 [Urine:825; Blood:250] Intake/Output this shift: No intake/output data recorded.  Recent Labs    06/20/20 0447  HGB 11.5*   Recent Labs    06/20/20 0447  WBC 6.3  RBC 3.38*  HCT 32.8*  PLT 207   Recent Labs    06/20/20 0447  NA 134*  K 3.7  CL 99  CO2 25  BUN 15  CREATININE 1.01  GLUCOSE 159*  CALCIUM 8.8*   No results for input(s): LABPT, INR in the last 72 hours.  Sensation intact distally Intact pulses distally Dorsiflexion/Plantar flexion intact Incision: scant drainage   Assessment/Plan: 1 Day Post-Op Procedure(s) (LRB): LEFT TOTAL HIP ARTHROPLASTY ANTERIOR APPROACH (Left) Up with therapy Discharge home with home health      Kathryne Hitch 06/20/2020, 7:40 AM

## 2020-06-21 ENCOUNTER — Encounter (HOSPITAL_COMMUNITY): Payer: Self-pay | Admitting: Orthopaedic Surgery

## 2020-06-28 ENCOUNTER — Other Ambulatory Visit: Payer: Self-pay | Admitting: Orthopaedic Surgery

## 2020-06-28 ENCOUNTER — Telehealth: Payer: Self-pay | Admitting: Orthopaedic Surgery

## 2020-06-28 MED ORDER — OXYCODONE HCL 5 MG PO TABS: 5.0000 mg | ORAL_TABLET | ORAL | 0 refills | Status: DC | PRN

## 2020-06-28 NOTE — Telephone Encounter (Signed)
Patient called requesting a refill of oxycodone. Please send to pharmacy on file. Patient phone number is [phone].

## 2020-06-28 NOTE — Telephone Encounter (Signed)
Please advise 06/19/20 L THA

## 2020-07-03 ENCOUNTER — Ambulatory Visit (INDEPENDENT_AMBULATORY_CARE_PROVIDER_SITE_OTHER): Payer: BC Managed Care – PPO | Admitting: Orthopaedic Surgery

## 2020-07-03 ENCOUNTER — Encounter: Payer: Self-pay | Admitting: Orthopaedic Surgery

## 2020-07-03 DIAGNOSIS — N529 Male erectile dysfunction, unspecified: Secondary | ICD-10-CM | POA: Insufficient documentation

## 2020-07-03 DIAGNOSIS — M25529 Pain in unspecified elbow: Secondary | ICD-10-CM | POA: Insufficient documentation

## 2020-07-03 DIAGNOSIS — F411 Generalized anxiety disorder: Secondary | ICD-10-CM | POA: Insufficient documentation

## 2020-07-03 DIAGNOSIS — Z86010 Personal history of colonic polyps: Secondary | ICD-10-CM | POA: Insufficient documentation

## 2020-07-03 DIAGNOSIS — Z96642 Presence of left artificial hip joint: Secondary | ICD-10-CM

## 2020-07-03 DIAGNOSIS — M549 Dorsalgia, unspecified: Secondary | ICD-10-CM | POA: Insufficient documentation

## 2020-07-03 NOTE — Progress Notes (Signed)
HPI: Brian Merritt returns today status post left total hip arthroplasty 06/19/2020.  He states he is overall doing well however the hip feels stiff and tight.  He denies any fevers chills shortness of breath chest pain.  Has been on 81 mg aspirin twice daily.  He does feel the Robaxin caused hives and he stopped this.  He is ambulating without any assistive device.  Physical exam: General well-developed well-nourished male no acute distress ambulates without any assistive device  Left hip positive seroma is aspirated after prep and 320 cc of serosanguineous fluids obtained patient tolerates well.  Staples are removed.  Incisions well approximated no signs of dehiscence or wound infection.  Left calf supple nontender.  Impression: Status post left total hip arthroplasty 06/19/2020  Plan: He will continue work on range of motion strengthening.  Scar tissue mobilization encouraged.  Like to see him back in 2 weeks just to see if he has recurrent seroma.  Possible aspiration at that time.  Questions encouraged and answered.  He is going to taking an 81 mg aspirin once daily for another week and then discontinue as he was on no aspirin prior to surgery.

## 2020-07-18 ENCOUNTER — Ambulatory Visit (INDEPENDENT_AMBULATORY_CARE_PROVIDER_SITE_OTHER): Payer: BC Managed Care – PPO | Admitting: Orthopaedic Surgery

## 2020-07-18 ENCOUNTER — Encounter: Payer: Self-pay | Admitting: Orthopaedic Surgery

## 2020-07-18 DIAGNOSIS — Z96642 Presence of left artificial hip joint: Secondary | ICD-10-CM

## 2020-07-18 NOTE — Progress Notes (Signed)
                                                                                                                                                                                 +                                                                                HPI Mr. Yurkovich returns today status post left total hip arthroplasty 06/19/2020.  He states he is overall doing well.  Feels that the incision is healing well.  He is the swelling the lateral aspect of the hip is diminishing.  He has no complaints.  He is taking no pain medications at this point.  Physical exam: Left hip good range of motion he is able actually cross his leg.  He has a very slight seroma.  Surgical incision is well-healed.  Calf supple nontender.  Dorsiflexion plantarflexion left ankle intact.  Impression: Status post left total hip arthroplasty 06/19/2020  Plan: We will see him back in approximately 5 months at that point time we will obtain an AP pelvis and lateral view of the left hip.  See him back sooner if there is any questions concerns.  Questions were encouraged and answered at length today.

## 2020-08-01 ENCOUNTER — Telehealth: Payer: Self-pay

## 2020-08-01 NOTE — Telephone Encounter (Signed)
Does not need antibiotics from my standpoint

## 2020-08-01 NOTE — Telephone Encounter (Signed)
Called pt and advised.  

## 2020-08-01 NOTE — Telephone Encounter (Signed)
Patient called he stated he is having a colonoscopy tomorrow he wants to know if he needs antibiotics. CB:[phone]

## 2020-08-10 ENCOUNTER — Telehealth: Payer: Self-pay | Admitting: Orthopaedic Surgery

## 2020-08-10 MED ORDER — OXYCODONE-ACETAMINOPHEN 5-325 MG PO TABS: 1.0000 | ORAL_TABLET | Freq: Three times a day (TID) | ORAL | 0 refills | Status: AC | PRN

## 2020-08-10 MED ORDER — OXYCODONE HCL 5 MG PO TABS: 5.0000 mg | ORAL_TABLET | Freq: Four times a day (QID) | ORAL | 0 refills | Status: DC | PRN

## 2020-08-10 NOTE — Telephone Encounter (Signed)
I sent in Percocet earlier today which is oxycodone with Tylenol.  I guess he wants me to send in just oxycodone.  I just sent that in to the same pharmacy just this minute.

## 2020-08-10 NOTE — Telephone Encounter (Signed)
Patient called. He says that he would like the oxycodone he had when he fist got surgery not the one that was prescribed to him today. His call back number is [phone]

## 2020-08-10 NOTE — Telephone Encounter (Signed)
Patient aware.

## 2020-08-10 NOTE — Telephone Encounter (Signed)
Pt called stating he wanted to make sure the oxycodone rx was going to be the immediate relief

## 2020-08-10 NOTE — Telephone Encounter (Signed)
Patient called requesting a refill oxycodone. Patient states his left hip is in severe pains. Patient is asking for this medication again. Pt states after exercise hip pain is severe. Please send to pharmacy on file. Patient phone number is [phone].

## 2020-09-12 ENCOUNTER — Other Ambulatory Visit: Payer: Self-pay | Admitting: Gastroenterology

## 2020-09-12 DIAGNOSIS — R112 Nausea with vomiting, unspecified: Secondary | ICD-10-CM

## 2020-09-14 ENCOUNTER — Ambulatory Visit
Admission: RE | Admit: 2020-09-14 | Discharge: 2020-09-14 | Disposition: A | Discharge status: Home / Self Care | Payer: BC Managed Care – PPO | Source: Ambulatory Visit | Attending: Gastroenterology | Admitting: Gastroenterology

## 2020-09-14 DIAGNOSIS — R112 Nausea with vomiting, unspecified: Secondary | ICD-10-CM

## 2020-09-17 ENCOUNTER — Other Ambulatory Visit (HOSPITAL_COMMUNITY): Payer: Self-pay | Admitting: Gastroenterology

## 2020-09-17 ENCOUNTER — Other Ambulatory Visit: Payer: Self-pay | Admitting: Gastroenterology

## 2020-09-17 DIAGNOSIS — K820 Obstruction of gallbladder: Secondary | ICD-10-CM

## 2020-09-25 ENCOUNTER — Encounter (HOSPITAL_COMMUNITY): Payer: Self-pay

## 2020-09-25 ENCOUNTER — Ambulatory Visit (HOSPITAL_COMMUNITY): Payer: BC Managed Care – PPO

## 2020-11-06 ENCOUNTER — Other Ambulatory Visit: Payer: Self-pay | Admitting: Orthopaedic Surgery

## 2020-11-07 ENCOUNTER — Telehealth: Payer: Self-pay | Admitting: Orthopaedic Surgery

## 2020-11-07 MED ORDER — OXYCODONE HCL 5 MG PO TABS: 5.0000 mg | ORAL_TABLET | Freq: Four times a day (QID) | ORAL | 0 refills | Status: AC | PRN

## 2020-11-07 NOTE — Telephone Encounter (Signed)
I called pt and let him know this was being cancelled and that he needs to contact Dr. Vear Clock office. He stated understanding

## 2020-11-07 NOTE — Telephone Encounter (Signed)
On 10/28/2020 pt was prescribed 5-25 oxycodone from a Dr.Phillips and it was a 30 day supply. Then today 11/07/2020 Dr.Blackman sent him just oxycodone 5. The pharmacist would like a phone call please. Laurie Panda from [employer] [facility] [employer] [phone]

## 2020-11-07 NOTE — Telephone Encounter (Signed)
Please advise. Should I call and ok the one from today?

## 2020-11-07 NOTE — Telephone Encounter (Signed)
Canceled the medication from [place].  Let the patient know that as well that he is getting narcotics already through his pain management specialist.

## 2021-01-15 ENCOUNTER — Ambulatory Visit: Payer: BC Managed Care – PPO | Admitting: Orthopaedic Surgery

## 2021-01-23 ENCOUNTER — Ambulatory Visit: Payer: Self-pay

## 2021-01-23 ENCOUNTER — Other Ambulatory Visit: Payer: Self-pay

## 2021-01-23 ENCOUNTER — Encounter: Payer: Self-pay | Admitting: Orthopaedic Surgery

## 2021-01-23 ENCOUNTER — Ambulatory Visit: Payer: BC Managed Care – PPO | Admitting: Orthopaedic Surgery

## 2021-01-23 DIAGNOSIS — Z96642 Presence of left artificial hip joint: Secondary | ICD-10-CM | POA: Diagnosis not present

## 2021-01-23 NOTE — Progress Notes (Signed)
The patient is a very active 55 year old gentleman who is 7 months status post a left total hip arthroplasty.  He is doing well and has no complaints.  He says his right hip is normal as well.  He has been actually training for half marathon in October.  I did talk with him about not being a daily runner after that point because he can wear down the bearing surface but he can still run on occasion.  Both hips move smoothly and fluidly with no issues at all.    An AP pelvis and lateral left hip shows a normal total hip arthroplasty that is well-seated with no complicating features.  The patient's right hip appears normal.  At this point follow-up can be as needed.  If there are any issues with his left hip he will let us know.  All questions and concerns were answered and addressed.

## 2021-04-10 IMAGING — CR DG HIP (WITH OR WITHOUT PELVIS) 2-3V LEFT
2 series · 2 of 2 positions shown · non-contrast
Comparison: None.

CLINICAL DATA: Left hip and groin pain with intermittent sciatica.
No reported injury.

EXAM:
DG HIP (WITH OR WITHOUT PELVIS) 2-3V LEFT

[t hip ap left]
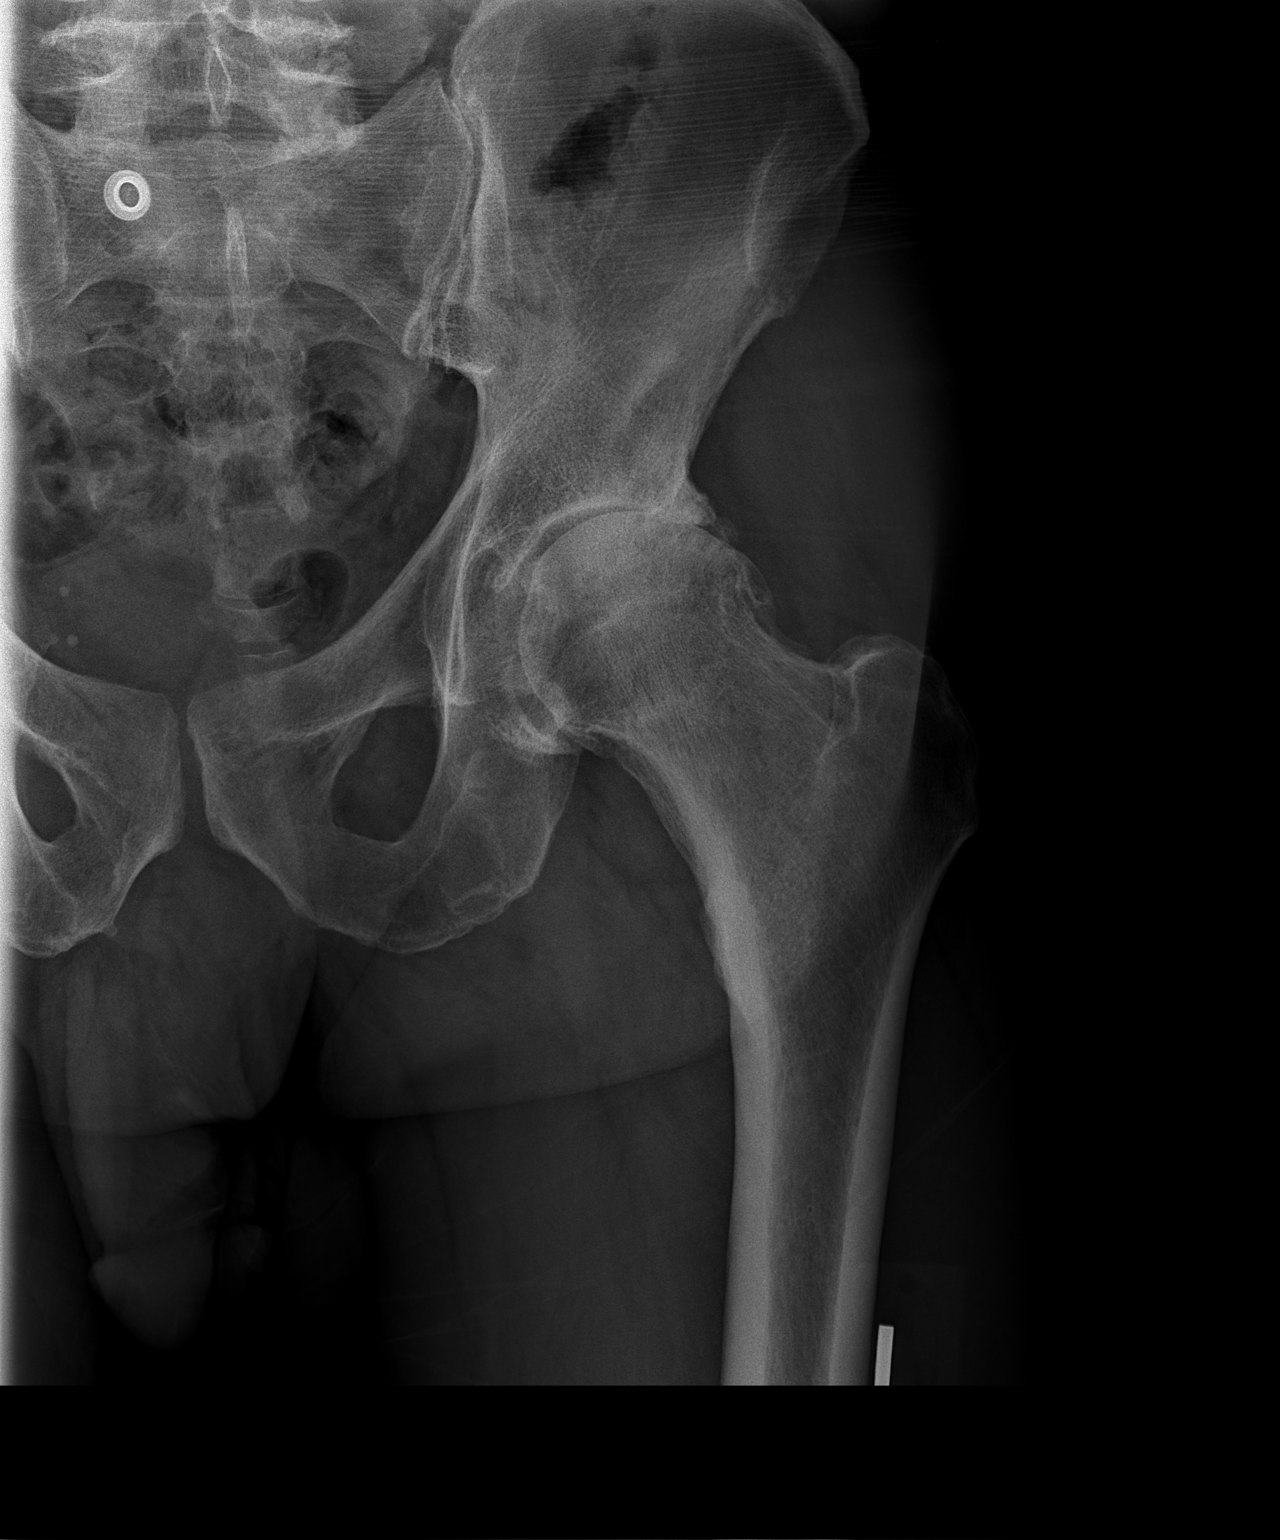

[t hip frog left]
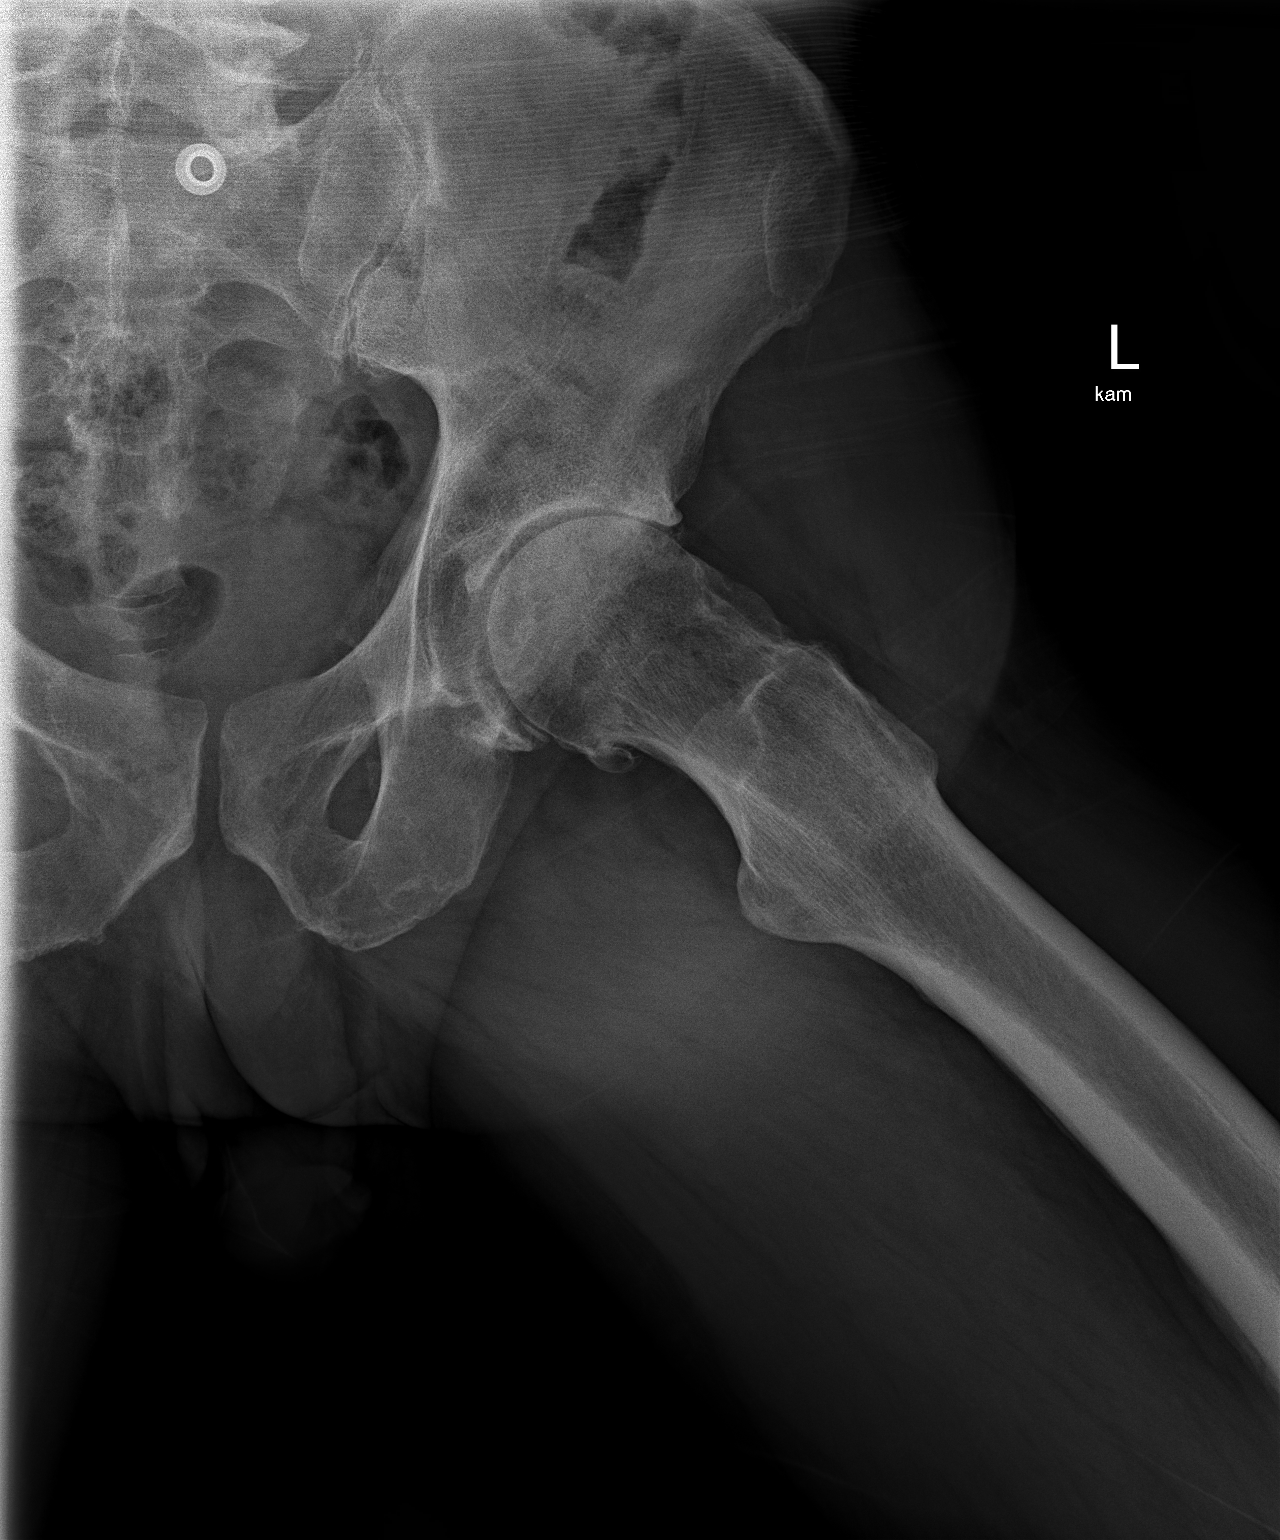

[2 of 2 positions shown; findings below may reference images not displayed]

FINDINGS: No fracture or dislocation in the left hip. No suspicious focal
osseous lesions. Moderate to severe left hip osteoarthritis with
marginal femoral head osteophytes.
IMPRESSION: Moderate to severe left hip osteoarthritis.

## 2022-11-03 IMAGING — DX DG PORTABLE PELVIS
1 series · 1 of 1 positions shown · non-contrast
Comparison: 04/10/2020

CLINICAL DATA: Left hip arthroplasty

EXAM:
PORTABLE PELVIS 1-2 VIEWS

[pelvis ap]
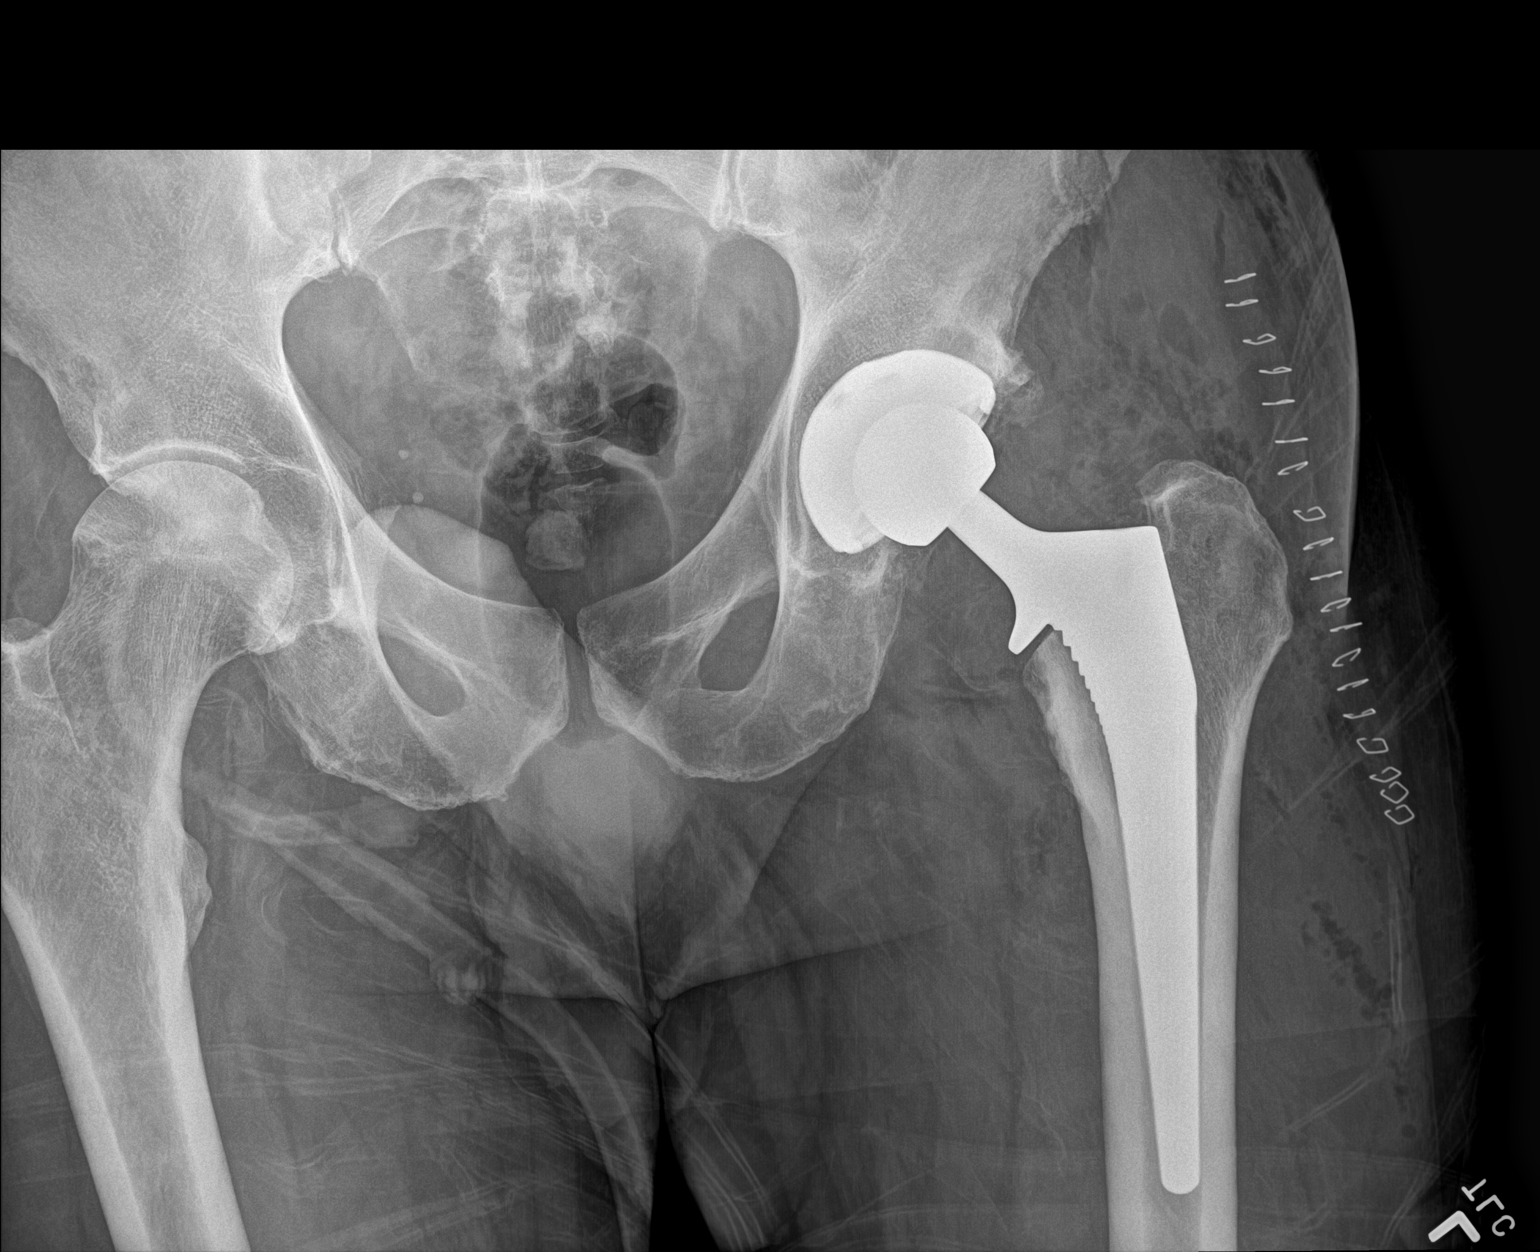

[1 of 1 positions shown; findings below may reference images not displayed]

FINDINGS: Single AP portable view of the pelvis demonstrates postsurgical
changes following left total hip arthroplasty. Arthroplasty
components appear in their expected alignment. No periprosthetic
fracture is identified. Expected postoperative changes within the
overlying soft tissues. Degenerative changes are present at the
contralateral right hip.
IMPRESSION: Satisfactory postoperative appearance status post left total hip
arthroplasty.

## 2022-11-03 IMAGING — RF DG C-ARM 1-60 MIN
1 series · 5 of 5 positions shown · IV contrast (agent unspecified)
Comparison: 04/10/2020

CLINICAL DATA: Status post total left hip arthroplasty.

EXAM:
DG C-ARM 1-60 MIN; OPERATIVE LEFT HIP WITH PELVIS
CONTRAST:  None
FLUOROSCOPY TIME:  Fluoroscopy Time:  29 seconds
Radiation Exposure Index (if provided by the fluoroscopic device):
3.4 mGy
Number of Acquired Spot Images: 0

[Series 1: unknown protocol · 0.20mm/px · 5 of 5 slices shown]
[im 1/5]
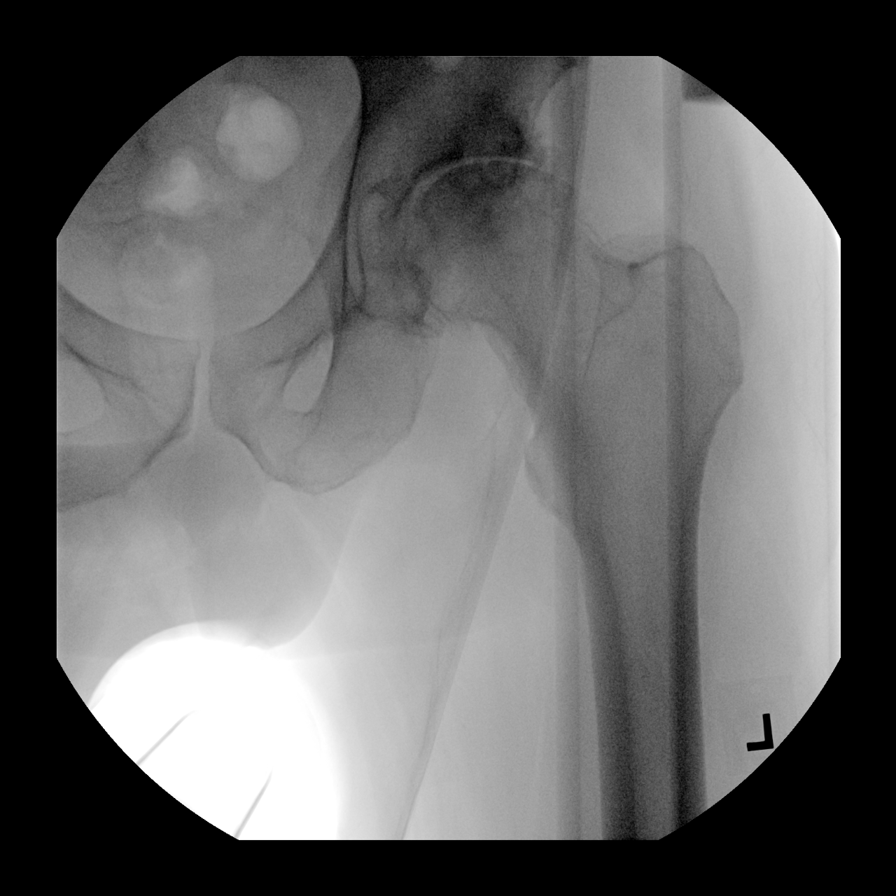
[im 2/5]
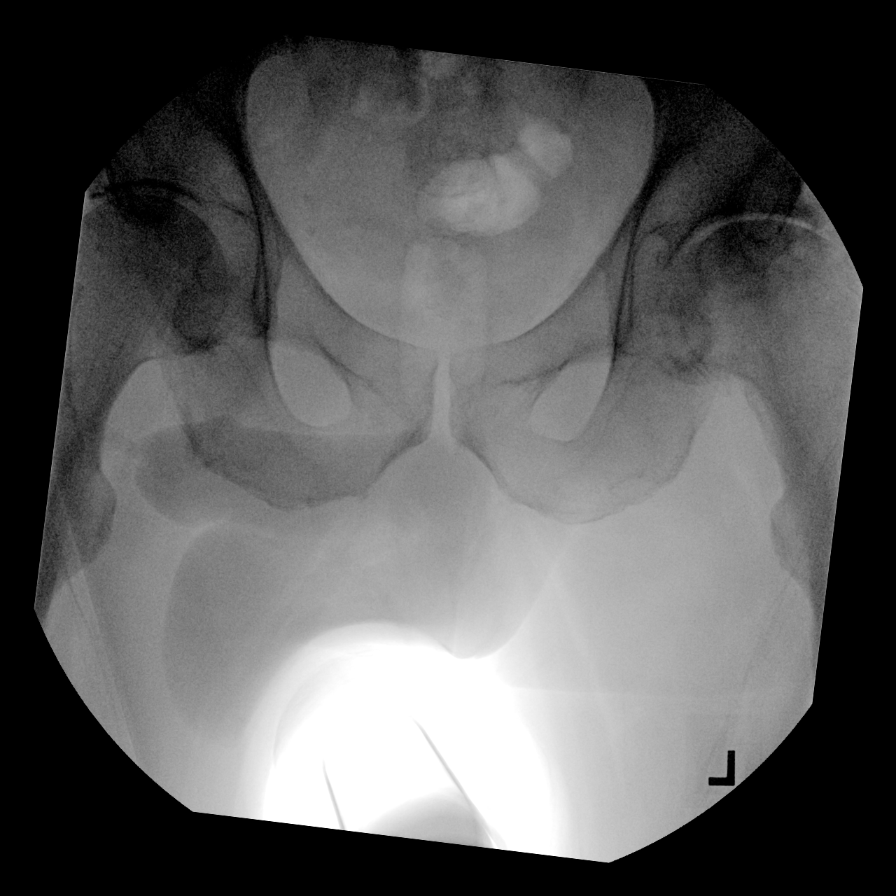
[im 3/5]
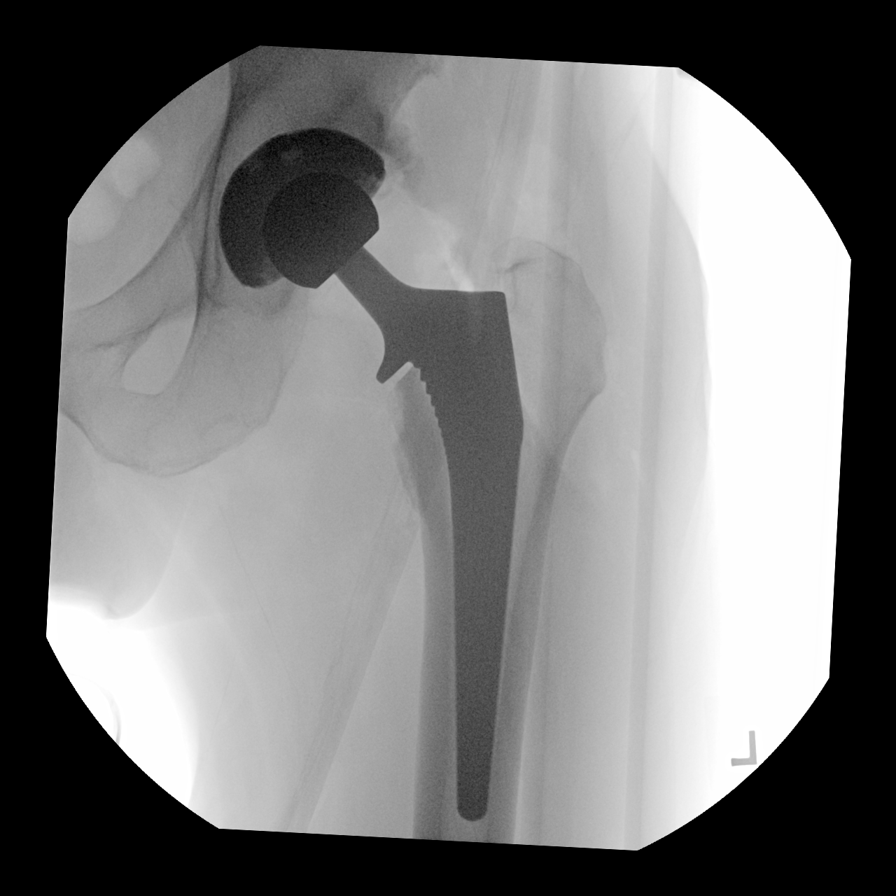
[im 4/5]
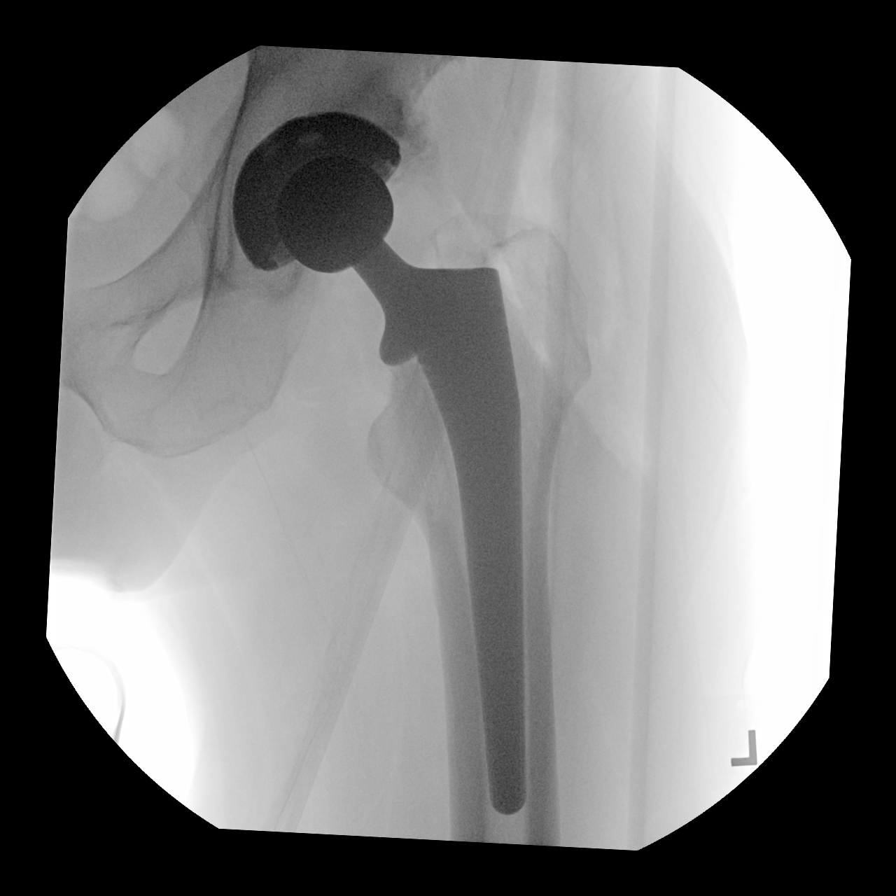
[im 5/5]
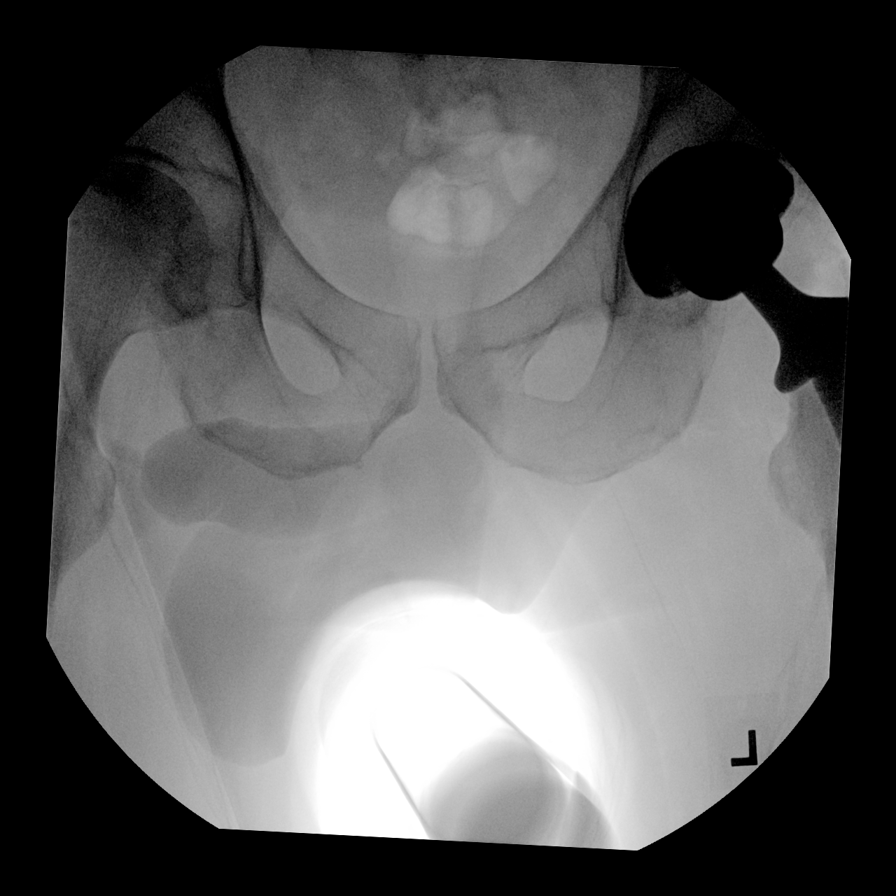

[5 of 5 positions shown; findings below may reference images not displayed]

FINDINGS: Five sequential images obtained via C-arm radiography in the
operating room were submitted. Images demonstrate left hip
arthroplasty. The hardware components are in anatomic alignment and
no periprosthetic fracture or subluxation identified.
IMPRESSION: 1. No complications identified status post left hip arthroplasty.
